# Patient Record
Sex: Male | Born: 1965 | Race: White | Hispanic: No | Marital: Single | Smoking: Former smoker
Health system: Southern US, Community
[De-identification: ages and names within clinical notes are randomized; demographics above are authoritative.]

## PROBLEM LIST (undated history)

## (undated) DIAGNOSIS — Z720 Tobacco use: Secondary | ICD-10-CM

## (undated) DIAGNOSIS — G5603 Carpal tunnel syndrome, bilateral upper limbs: Secondary | ICD-10-CM

## (undated) DIAGNOSIS — E66811 Obesity, class 1: Secondary | ICD-10-CM

## (undated) DIAGNOSIS — G44009 Cluster headache syndrome, unspecified, not intractable: Secondary | ICD-10-CM

## (undated) DIAGNOSIS — J449 Chronic obstructive pulmonary disease, unspecified: Secondary | ICD-10-CM

## (undated) DIAGNOSIS — S62339A Displaced fracture of neck of unspecified metacarpal bone, initial encounter for closed fracture: Secondary | ICD-10-CM

## (undated) DIAGNOSIS — E669 Obesity, unspecified: Secondary | ICD-10-CM

## (undated) HISTORY — DX: Obesity, unspecified: E66.9

## (undated) HISTORY — DX: Obesity, class 1: E66.811

## (undated) HISTORY — DX: Cluster headache syndrome, unspecified, not intractable: G44.009

## (undated) HISTORY — DX: Tobacco use: Z72.0

---
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
## 2010-08-29 ENCOUNTER — Ambulatory Visit (INDEPENDENT_AMBULATORY_CARE_PROVIDER_SITE_OTHER): Payer: Self-pay | Admitting: Family Medicine

## 2010-08-29 ENCOUNTER — Encounter: Payer: Self-pay | Admitting: Family Medicine

## 2010-08-29 DIAGNOSIS — R509 Fever, unspecified: Secondary | ICD-10-CM

## 2010-08-29 LAB — CONVERTED CEMR LAB
Inflenza A Ag: NEGATIVE
Influenza B Ag: NEGATIVE
Nitrite: NEGATIVE
Specific Gravity, Urine: 1.025
WBC Urine, dipstick: NEGATIVE

## 2010-08-31 ENCOUNTER — Encounter: Payer: Self-pay | Admitting: Family Medicine

## 2010-09-04 NOTE — Assessment & Plan Note (Signed)
Summary: CHILLS/HEADACHE rm 4   Vital Signs:  Patient Profile:   45 Years Old Male CC:      chills and HA Height:     73 inches Weight:      196.50 pounds O2 Sat:      98 % O2 treatment:    Room Air Temp:     99.9 degrees F oral Pulse rate:   77 / minute Resp:     18 per minute BP sitting:   135 / 74  (left arm) Cuff size:   large  Vitals Entered By: Clemens Catholic LPN (August 29, 2010 2:40 PM)                  Updated Prior Medication List: No Medications Current Allergies: No known allergies History of Present Illness Chief Complaint: chills and HA History of Present Illness:  Subjective: Patient complains of a three day history of chills, myalgias, and mild headache.  No other localizing symptoms.  No history of tick bites. No sore throat No cough No pleuritic pain No wheezing No nasal congestion No post-nasal drainage No sinus pain/pressure No itchy/red eyes No earache No hemoptysis No SOB No nausea No vomiting No abdominal pain No diarrhea No skin rashes + mild fatigue No urinary symptoms  No rash    REVIEW OF SYSTEMS Constitutional Symptoms       Complains of chills.     Denies fever, night sweats, weight loss, weight gain, and fatigue.  Eyes       Denies change in vision, eye pain, eye discharge, glasses, contact lenses, and eye surgery. Ear/Nose/Throat/Mouth       Denies hearing loss/aids, change in hearing, ear pain, ear discharge, dizziness, frequent runny nose, frequent nose bleeds, sinus problems, sore throat, hoarseness, and tooth pain or bleeding.  Respiratory       Denies dry cough, productive cough, wheezing, shortness of breath, asthma, bronchitis, and emphysema/COPD.  Cardiovascular       Denies murmurs, chest pain, and tires easily with exhertion.    Gastrointestinal       Denies stomach pain, nausea/vomiting, diarrhea, constipation, blood in bowel movements, and indigestion. Genitourniary       Denies painful urination, kidney  stones, and loss of urinary control. Neurological       Complains of headaches.      Denies paralysis, seizures, and fainting/blackouts. Musculoskeletal       Denies muscle pain, joint pain, joint stiffness, decreased range of motion, redness, swelling, muscle weakness, and gout.  Skin       Denies bruising, unusual mles/lumps or sores, and hair/skin or nail changes.  Psych       Denies mood changes, temper/anger issues, anxiety/stress, speech problems, depression, and sleep problems. Other Comments: pt c/o chills, HA and head pressure, fever and achy x 3days. he has not taken any OTC meds.   Past History:  Past Medical History: Unremarkable  Past Surgical History: Denies surgical history  Family History: Family History Diabetes 1st degree relative Family History Hypertension  Social History: Current Smoker- 1PPD Drug use-no Alcohol use-no Smoking Status:  current Drug Use:  no   Appearance:  Patient appears healthy, stated age, and in no acute distress  Eyes:  Pupils are equal, round, and reactive to light and accomdation.  Extraocular movement is intact.  Conjunctivae are not inflamed.  Ears:  Canals normal.  Tympanic membranes normal.   Nose:  Normal septum.  Normal turbinates, mildly congested.   No sinus  tenderness present.  Mouth:  No lesions Pharynx:  Normal  Neck:  Supple.  Posterior shotty nodes somewhat prominent but nontender Lungs:  Clear to auscultation.  Breath sounds are equal.  Heart:  Regular rate and rhythm without murmurs, rubs, or gallops.  Abdomen:  Nontender without masses or hepatosplenomegaly.  Bowel sounds are present.  No CVA or flank tenderness.  Extremities:  No edema.  Skin:  No rash Flu test:  Negative urinalysis (dipstick):  2+ blood CBC:  WBC 5.3 Assessment New Problems: FEVER UNSPECIFIED (ICD-780.60) FAMILY HISTORY DIABETES 1ST DEGREE RELATIVE (ICD-V18.0)  SUSPECT EARLY VIRAL SYNDROME  Plan New Orders: Influenza A/B,AG, EIA  [[id number]] Urinalysis [CPT-81003] CBC w/Diff [[id number]] New Patient Level III [29562] Planning Comments:   Treat symptomatically for now:  rest, increased fluids, Tylenol or Ibuprofen as needed, check temp daily. Follow-up with PCP if not improving 3 to 4 days.   The patient and/or caregiver has been counseled thoroughly with regard to medications prescribed including dosage, schedule, interactions, rationale for use, and possible side effects and they verbalize understanding.  Diagnoses and expected course of recovery discussed and will return if not improved as expected or if the condition worsens. Patient and/or caregiver verbalized understanding.   Patient Instructions: 1)  If cold-like symptoms develop, begin Mucinex D (guaifenesin with decongestant) twice daily for congestion. 2)  Increase fluid intake, rest. 3)  If cough develops at night, begin Delsym Cough Suppressant at bedtime. 4)  May use Afrin nasal spray (or generic oxymetazoline) twice daily for about 5 days.  Also recommend using saline nasal spray several times daily and/or saline nasal irrigation. 5)  Followup with family doctor if not improving 7 to 10 days.   Orders Added: 1)  Influenza A/B,AG, EIA [[id number]] 2)  Urinalysis [CPT-81003] 3)  CBC w/Diff [[id number]] 4)  New Patient Level III [99203]    Laboratory Results   Urine Tests  Date/Time Received: August 29, 2010 3:03 PM  Date/Time Reported: August 29, 2010 3:03 PM   Routine Urinalysis   Color: amber Appearance: Clear Glucose: negative   (Normal Range: Negative) Bilirubin: 1+   (Normal Range: Negative) Ketone: trace (5)   (Normal Range: Negative) Spec. Gravity: 1.025   (Normal Range: 1.003-1.035) Blood: 2+   (Normal Range: Negative) pH: 6.0   (Normal Range: 5.0-8.0) Protein: 2+   (Normal Range: Negative) Urobilinogen: 4.0   (Normal Range: 0-1) Nitrite: negative   (Normal Range: Negative) Leukocyte Esterace: negative   (Normal Range:  Negative)    Date/Time Received: August 29, 2010 2:52 PM  Date/Time Reported: August 29, 2010 2:52 PM   Other Tests  Influenza A: negative Influenza B: negative  Kit Test Internal QC: Negative   (Normal Range: Negative)

## 2012-06-17 DIAGNOSIS — J449 Chronic obstructive pulmonary disease, unspecified: Secondary | ICD-10-CM

## 2012-06-17 HISTORY — DX: Chronic obstructive pulmonary disease, unspecified: J44.9

## 2013-02-25 ENCOUNTER — Ambulatory Visit (INDEPENDENT_AMBULATORY_CARE_PROVIDER_SITE_OTHER): Payer: Self-pay | Admitting: Sports Medicine

## 2013-02-25 ENCOUNTER — Ambulatory Visit (INDEPENDENT_AMBULATORY_CARE_PROVIDER_SITE_OTHER): Payer: Self-pay

## 2013-02-25 ENCOUNTER — Encounter: Payer: Self-pay | Admitting: Sports Medicine

## 2013-02-25 VITALS — BP 125/77 | HR 54 | Wt 199.0 lb

## 2013-02-25 DIAGNOSIS — R079 Chest pain, unspecified: Secondary | ICD-10-CM

## 2013-02-25 DIAGNOSIS — R071 Chest pain on breathing: Secondary | ICD-10-CM

## 2013-02-25 DIAGNOSIS — R0989 Other specified symptoms and signs involving the circulatory and respiratory systems: Secondary | ICD-10-CM

## 2013-02-25 DIAGNOSIS — R0781 Pleurodynia: Secondary | ICD-10-CM

## 2013-02-25 DIAGNOSIS — J449 Chronic obstructive pulmonary disease, unspecified: Secondary | ICD-10-CM | POA: Insufficient documentation

## 2013-02-25 MED ORDER — AZITHROMYCIN 250 MG PO TABS: ORAL_TABLET | ORAL | Status: DC

## 2013-02-25 MED ORDER — PREDNISONE 50 MG PO TABS: 50.0000 mg | ORAL_TABLET | Freq: Every day | ORAL | Status: DC

## 2013-02-25 MED ORDER — NAPROXEN 500 MG PO TABS: 500.0000 mg | ORAL_TABLET | Freq: Two times a day (BID) | ORAL | Status: DC

## 2013-02-25 NOTE — Assessment & Plan Note (Signed)
He does have pleuritic pain, left worse than right. Chest x-ray, prednisone, azithromycin, naproxen. Also checking some blood work. Return to see me in 2 weeks to see how things are going.

## 2013-02-25 NOTE — Progress Notes (Signed)
  Subjective:    CC: Establish care, chest pain.   HPI:  This is a very pleasant 47 year old male who comes here to establish care. Unfortunately for the past week and a half he's had a left-sided chest pain it is worse with deep breaths. Pain is localized, doesn't radiate, moderate, persistent, he does have a mild cough is mildly productive. No constitutional symptoms other than last week he felt somewhat hot while fishing. No GI symptoms, no rashes. His girlfriend had similar symptoms. He is a smoker, has cut down to approximately one cigarette per day during this illness. He does have pain predominately when he lays flat but denies any lower extremity swelling, or any exertional chest pain. No night sweats.  Past medical history, Surgical history, Family history not pertinant except as noted below, Social history, Allergies, and medications have been entered into the medical record, reviewed, and no changes needed.   Review of Systems: No headache, visual changes, nausea, vomiting, diarrhea, constipation, dizziness, abdominal pain, skin rash, fevers, chills, night sweats, swollen lymph nodes, weight loss, chest pain, body aches, joint swelling, muscle aches, shortness of breath, mood changes, visual or auditory hallucinations.  Objective:    General: Well Developed, well nourished, and in no acute distress.  Neuro: Alert and oriented x3, extra-ocular muscles intact, sensation grossly intact.  HEENT: Normocephalic, atraumatic, pupils equal round reactive to light, neck supple, no masses, no lymphadenopathy, thyroid nonpalpable.  Skin: Warm and dry, no rashes noted.  Cardiac: Regular rate and rhythm, no murmurs rubs or gallops.  Respiratory: Coarse sounds in the right lower lobe. Not using accessory muscles, speaking in full sentences.  Abdominal: Soft, nontender, nondistended, positive bowel sounds, no masses, no organomegaly.  Musculoskeletal: Shoulder, elbow, wrist, hip, knee, ankle stable,  and with full range of motion.  Impression and Recommendations:    The patient was counselled, risk factors were discussed, anticipatory guidance given.

## 2013-02-26 LAB — CBC WITH DIFFERENTIAL/PLATELET
Basophils Absolute: 0 K/uL (ref 0.0–0.1)
Basophils Relative: 0 % (ref 0–1)
Eosinophils Absolute: 0.2 10*3/uL (ref 0.0–0.7)
Eosinophils Relative: 2 % (ref 0–5)
HCT: 45.9 % (ref 39.0–52.0)
Hemoglobin: 15.2 g/dL (ref 13.0–17.0)
Lymphocytes Relative: 32 % (ref 12–46)
Lymphs Abs: 2.5 10*3/uL (ref 0.7–4.0)
MCH: 27 pg (ref 26.0–34.0)
MCHC: 33.1 g/dL (ref 30.0–36.0)
MCV: 81.7 fL (ref 78.0–100.0)
Monocytes Absolute: 0.7 K/uL (ref 0.1–1.0)
Monocytes Relative: 9 % (ref 3–12)
Neutro Abs: 4.3 K/uL (ref 1.7–7.7)
Neutrophils Relative %: 57 % (ref 43–77)
Platelets: 201 K/uL (ref 150–400)
RBC: 5.62 MIL/uL (ref 4.22–5.81)
RDW: 13.6 % (ref 11.5–15.5)
WBC: 7.7 10*3/uL (ref 4.0–10.5)

## 2013-02-26 LAB — COMPREHENSIVE METABOLIC PANEL WITH GFR
ALT: 25 U/L (ref 0–53)
Albumin: 4.4 g/dL (ref 3.5–5.2)
CO2: 28 meq/L (ref 19–32)
Calcium: 9.4 mg/dL (ref 8.4–10.5)
Chloride: 104 meq/L (ref 96–112)
Potassium: 4.4 meq/L (ref 3.5–5.3)
Sodium: 137 meq/L (ref 135–145)
Total Bilirubin: 0.6 mg/dL (ref 0.3–1.2)
Total Protein: 7 g/dL (ref 6.0–8.3)

## 2013-02-26 LAB — COMPREHENSIVE METABOLIC PANEL
AST: 17 U/L (ref 0–37)
Alkaline Phosphatase: 82 U/L (ref 39–117)
BUN: 15 mg/dL (ref 6–23)
Creat: 1.04 mg/dL (ref 0.50–1.35)
Glucose, Bld: 78 mg/dL (ref 70–99)

## 2013-02-26 LAB — BRAIN NATRIURETIC PEPTIDE: Brain Natriuretic Peptide: 3.7 pg/mL (ref 0.0–100.0)

## 2013-03-11 ENCOUNTER — Ambulatory Visit (INDEPENDENT_AMBULATORY_CARE_PROVIDER_SITE_OTHER): Payer: Self-pay | Admitting: Sports Medicine

## 2013-03-11 ENCOUNTER — Encounter: Payer: Self-pay | Admitting: Sports Medicine

## 2013-03-11 VITALS — BP 116/69 | HR 53 | Wt 204.0 lb

## 2013-03-11 DIAGNOSIS — J449 Chronic obstructive pulmonary disease, unspecified: Secondary | ICD-10-CM

## 2013-03-11 DIAGNOSIS — J4489 Other specified chronic obstructive pulmonary disease: Secondary | ICD-10-CM

## 2013-03-11 MED ORDER — ALBUTEROL SULFATE HFA 108 (90 BASE) MCG/ACT IN AERS: 2.0000 | INHALATION_SPRAY | RESPIRATORY_TRACT | Status: DC | PRN

## 2013-03-11 MED ORDER — NICOTINE 7 MG/24HR TD PT24: 1.0000 | MEDICATED_PATCH | TRANSDERMAL | Status: DC

## 2013-03-11 NOTE — Assessment & Plan Note (Addendum)
This is early, he is now asymptomatic after the prior treatment. Albuterol. He will return for spirometry. We discussed the pathophysiology in detail. He is currently working very hard on smoking cessation. Like to see him back in approximately one month. Pneumovax, flu vaccine at the next visit for his PFTs.

## 2013-03-11 NOTE — Addendum Note (Signed)
Addended by: Monica Becton on: 03/11/2013 01:30 PM   Modules accepted: Level of Service

## 2013-03-11 NOTE — Patient Instructions (Addendum)

## 2013-03-11 NOTE — Progress Notes (Signed)
  Subjective:    CC: Followup  HPI: Scott Reid was having some shortness of breath, pleuritic chest pain, and a cough at the last visit, chest x-ray isn't showing hyperaeration suggestive of early obstructive lung disease, I placed him on steroids, NSAIDs, antibiotics, and symptoms have improved. He does smoke, but only goes about 2-3 cigarettes per day. This is a decrease from before, he is eager to quit smoking, and eager to improve his health. He is asymptomatic today.  Past medical history, Surgical history, Family history not pertinant except as noted below, Social history, Allergies, and medications have been entered into the medical record, reviewed, and no changes needed.   Review of Systems: No fevers, chills, night sweats, weight loss, chest pain, or shortness of breath.   Objective:    General: Well Developed, well nourished, and in no acute distress.  Neuro: Alert and oriented x3, extra-ocular muscles intact, sensation grossly intact.  HEENT: Normocephalic, atraumatic, pupils equal round reactive to light, neck supple, no masses, no lymphadenopathy, thyroid nonpalpable.  Skin: Warm and dry, no rashes. Cardiac: Regular rate and rhythm, no murmurs rubs or gallops, no lower extremity edema.  Respiratory: Clear to auscultation bilaterally with the exception of a mild right-sided expiratory wheeze that sounds. Not using accessory muscles, speaking in full sentences.  Impression and Recommendations:    I spent 40 minutes with this patient, greater than 50% was face-to-face time counseling regarding obstructive lung disease.

## 2013-04-08 ENCOUNTER — Ambulatory Visit: Payer: Self-pay | Admitting: Sports Medicine

## 2017-06-17 DIAGNOSIS — G5603 Carpal tunnel syndrome, bilateral upper limbs: Secondary | ICD-10-CM

## 2017-06-17 HISTORY — DX: Carpal tunnel syndrome, bilateral upper limbs: G56.03

## 2018-02-11 DIAGNOSIS — S62339A Displaced fracture of neck of unspecified metacarpal bone, initial encounter for closed fracture: Secondary | ICD-10-CM

## 2018-02-11 HISTORY — DX: Displaced fracture of neck of unspecified metacarpal bone, initial encounter for closed fracture: S62.339A

## 2018-02-17 ENCOUNTER — Ambulatory Visit (INDEPENDENT_AMBULATORY_CARE_PROVIDER_SITE_OTHER): Payer: Self-pay | Admitting: Orthopaedic Surgery

## 2018-02-17 VITALS — Ht 73.0 in | Wt 204.0 lb

## 2018-02-17 DIAGNOSIS — S62367A Nondisplaced fracture of neck of fifth metacarpal bone, left hand, initial encounter for closed fracture: Secondary | ICD-10-CM
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
## 2018-02-20 ENCOUNTER — Telehealth (INDEPENDENT_AMBULATORY_CARE_PROVIDER_SITE_OTHER): Payer: Self-pay | Admitting: Orthopaedic Surgery

## 2018-02-20 ENCOUNTER — Other Ambulatory Visit (INDEPENDENT_AMBULATORY_CARE_PROVIDER_SITE_OTHER): Payer: Self-pay

## 2018-02-20 MED ORDER — PREDNISONE 10 MG (21) PO TBPK: ORAL_TABLET | ORAL | 0 refills | Status: DC

## 2018-02-20 NOTE — Telephone Encounter (Signed)
Try prednisone pak.

## 2018-02-20 NOTE — Telephone Encounter (Signed)
Sent in to pharm.

## 2018-02-20 NOTE — Telephone Encounter (Signed)
Patient called advised he is still having pain and stiffness in his neck. Patient also said both hands are going numb. Patient asked if he need to schedule another appointment sooner than 03/10/18 to have his neck and hands checked. The number  to contact patient is [phone]

## 2018-02-20 NOTE — Telephone Encounter (Signed)
See message below °

## 2018-03-10 ENCOUNTER — Encounter (INDEPENDENT_AMBULATORY_CARE_PROVIDER_SITE_OTHER): Payer: Self-pay | Admitting: Orthopaedic Surgery

## 2018-03-10 ENCOUNTER — Ambulatory Visit (INDEPENDENT_AMBULATORY_CARE_PROVIDER_SITE_OTHER): Payer: Self-pay

## 2018-03-10 ENCOUNTER — Ambulatory Visit (INDEPENDENT_AMBULATORY_CARE_PROVIDER_SITE_OTHER): Payer: Self-pay | Admitting: Orthopaedic Surgery

## 2018-03-10 DIAGNOSIS — M79642 Pain in left hand: Secondary | ICD-10-CM

## 2018-03-10 DIAGNOSIS — S62367A Nondisplaced fracture of neck of fifth metacarpal bone, left hand, initial encounter for closed fracture: Secondary | ICD-10-CM
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
## 2018-03-10 NOTE — Addendum Note (Signed)
Addended by: Albertina ParrGARCIA, Jeffrey Graefe on: 03/10/2018 11:01 AM   Modules accepted: Orders

## 2018-04-01 ENCOUNTER — Ambulatory Visit (INDEPENDENT_AMBULATORY_CARE_PROVIDER_SITE_OTHER): Payer: Self-pay | Admitting: Physical Medicine and Rehabilitation

## 2018-04-01 ENCOUNTER — Encounter (INDEPENDENT_AMBULATORY_CARE_PROVIDER_SITE_OTHER): Payer: Self-pay | Admitting: Physical Medicine and Rehabilitation

## 2018-04-01 DIAGNOSIS — R202 Paresthesia of skin: Secondary | ICD-10-CM

## 2018-04-01 NOTE — Progress Notes (Signed)
 .  Numeric Pain Rating Scale and Functional Assessment Average Pain 7   In the last MONTH (on 0-10 scale) has pain interfered with the following?  1. General activity like being  able to carry out your everyday physical activities such as walking, climbing stairs, carrying groceries, or moving a chair?  Rating(5)   
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
## 2018-04-06 NOTE — Procedures (Signed)
EMG & NCV Findings: Evaluation of the left median motor and the right median motor nerves showed prolonged distal onset latency (L4.8, R6.6 ms) and decreased conduction velocity (Elbow-Wrist, L43, R40 m/s).  The right ulnar motor nerve showed decreased conduction velocity (B Elbow-Wrist, 52 m/s).  The left median (across palm) sensory nerve showed prolonged distal peak latency (Wrist, 5.2 ms) and prolonged distal peak latency (Palm, 2.2 ms).  The right median (across palm) sensory nerve showed no response (Palm), prolonged distal peak latency (6.7 ms), and reduced amplitude (3.6 V).  The right ulnar sensory nerve showed prolonged distal peak latency (4.1 ms), reduced amplitude (12.9 V), and decreased conduction velocity (Wrist-5th Digit, 34 m/s).  All remaining nerves (as indicated in the following tables) were within normal limits.  Left vs. Right side comparison data for the median motor nerve indicates abnormal L-R latency difference (1.8 ms).  The ulnar motor nerve indicates abnormal L-R amplitude difference (66.7 %).  The ulnar sensory nerve indicates abnormal L-R latency difference (0.8 ms).    All examined muscles (as indicated in the following table) showed no evidence of electrical instability.    Impression: The above electrodiagnostic study is ABNORMAL and reveals evidence of:  1.  A moderate BILATERAL median nerve entrapment at the wrist (carpal tunnel syndrome) affecting sensory and motor components.   2.  A mild right ulnar nerve entrapment at the elbow( cubital tunnel syndrome) affecting sensory components.   There is no significant electrodiagnostic evidence of any other focal nerve entrapment, brachial plexopathy or cervical radiculopathy.   Recommendations: 1.  Follow-up with referring physician. 2.  Continue current management of symptoms. 3.  Continue use of resting splint at night-time and as needed during the day. 4.  Suggest surgical  evaluation.  ___________________________ Scott Reid FAAPMR Board Certified, American Board of Physical Medicine and Rehabilitation    Nerve Conduction Studies Anti Sensory Summary Table   Stim Site NR Peak (ms) Norm Peak (ms) P-T Amp (V) Norm P-T Amp Site1 Site2 Delta-P (ms) Dist (cm) Vel (m/s) Norm Vel (m/s)  Left Median Acr Palm Anti Sensory (2nd Digit)  32.5C  Wrist    *5.2 <3.6 13.6 >10 Wrist Palm 3.0 0.0    Palm    *2.2 <2.0 14.3         Right Median Acr Palm Anti Sensory (2nd Digit)  30.6C  Wrist    *6.7 <3.6 *3.6 >10 Wrist Palm  0.0    Palm *NR  <2.0          Left Radial Anti Sensory (Base 1st Digit)  31.6C  Wrist    2.2 <3.1 28.5  Wrist Base 1st Digit 2.2 0.0    Right Radial Anti Sensory (Base 1st Digit)  31.5C  Wrist    2.3 <3.1 13.6  Wrist Base 1st Digit 2.3 0.0    Left Ulnar Anti Sensory (5th Digit)  32.3C  Wrist    3.3 <3.7 19.6 >15.0 Wrist 5th Digit 3.3 14.0 42 >38  Right Ulnar Anti Sensory (5th Digit)  31.5C  Wrist    *4.1 <3.7 *12.9 >15.0 Wrist 5th Digit 4.1 14.0 *34 >38   Motor Summary Table   Stim Site NR Onset (ms) Norm Onset (ms) O-P Amp (mV) Norm O-P Amp Site1 Site2 Delta-0 (ms) Dist (cm) Vel (m/s) Norm Vel (m/s)  Left Median Motor (Abd Poll Brev)  31.6C  Wrist    *4.8 <4.2 7.2 >5 Elbow Wrist 4.9 21.0 *43 >50  Elbow    9.7  6.9         Right Median Motor (Abd Poll Brev)  31.3C  Wrist    *6.6 <4.2 7.2 >5 Elbow Wrist 5.4 21.5 *40 >50  Elbow    12.0  7.0         Left Ulnar Motor (Abd Dig Min)  31.6C  Wrist    3.6 <4.2 10.2 >3 B Elbow Wrist 3.9 21.2 54 >53  B Elbow    7.5  9.4  A Elbow B Elbow 1.6 10.0 63 >53  A Elbow    9.1  9.2         Right Ulnar Motor (Abd Dig Min)  31.1C  Wrist    4.0 <4.2 3.4 >3 B Elbow Wrist 4.1 21.5 *52 >53  B Elbow    8.1  5.0  A Elbow B Elbow 1.9 10.0 53 >53  A Elbow    10.0  7.3          EMG   Side Muscle Nerve Root Ins Act Fibs Psw Amp Dur Poly Recrt Int Dennie Bible Comment  Right Abd Poll Brev Median C8-T1 Nml Nml Nml  Nml Nml 0 Nml Nml   Right 1stDorInt Ulnar C8-T1 Nml Nml Nml Nml Nml 0 Nml Nml   Right PronatorTeres Median C6-7 Nml Nml Nml Nml Nml 0 Nml Nml   Right Biceps Musculocut C5-6 Nml Nml Nml Nml Nml 0 Nml Nml   Right Deltoid Axillary C5-6 Nml Nml Nml Nml Nml 0 Nml Nml     Nerve Conduction Studies Anti Sensory Left/Right Comparison   Stim Site L Lat (ms) R Lat (ms) L-R Lat (ms) L Amp (V) R Amp (V) L-R Amp (%) Site1 Site2 L Vel (m/s) R Vel (m/s) L-R Vel (m/s)  Median Acr Palm Anti Sensory (2nd Digit)  32.5C  Wrist *5.2 *6.7 1.5 13.6 *3.6 73.5 Wrist Palm     Palm *2.2   14.3         Radial Anti Sensory (Base 1st Digit)  31.6C  Wrist 2.2 2.3 0.1 28.5 13.6 52.3 Wrist Base 1st Digit     Ulnar Anti Sensory (5th Digit)  32.3C  Wrist 3.3 *4.1 *0.8 19.6 *12.9 34.2 Wrist 5th Digit 42 *34 8   Motor Left/Right Comparison   Stim Site L Lat (ms) R Lat (ms) L-R Lat (ms) L Amp (mV) R Amp (mV) L-R Amp (%) Site1 Site2 L Vel (m/s) R Vel (m/s) L-R Vel (m/s)  Median Motor (Abd Poll Brev)  31.6C  Wrist *4.8 *6.6 *1.8 7.2 7.2 0.0 Elbow Wrist *43 *40 3  Elbow 9.7 12.0 2.3 6.9 7.0 1.4       Ulnar Motor (Abd Dig Min)  31.6C  Wrist 3.6 4.0 0.4 10.2 3.4 *66.7 B Elbow Wrist 54 *52 2  B Elbow 7.5 8.1 0.6 9.4 5.0 46.8 A Elbow B Elbow 63 53 10  A Elbow 9.1 10.0 0.9 9.2 7.3 20.7          Waveforms:

## 2018-04-07 ENCOUNTER — Ambulatory Visit (INDEPENDENT_AMBULATORY_CARE_PROVIDER_SITE_OTHER): Payer: Self-pay

## 2018-04-07 ENCOUNTER — Ambulatory Visit (INDEPENDENT_AMBULATORY_CARE_PROVIDER_SITE_OTHER): Payer: Self-pay | Admitting: Orthopaedic Surgery

## 2018-04-07 DIAGNOSIS — G5601 Carpal tunnel syndrome, right upper limb: Secondary | ICD-10-CM

## 2018-04-07 DIAGNOSIS — S62367A Nondisplaced fracture of neck of fifth metacarpal bone, left hand, initial encounter for closed fracture: Secondary | ICD-10-CM

## 2018-04-07 DIAGNOSIS — G5602 Carpal tunnel syndrome, left upper limb: Secondary | ICD-10-CM
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
## 2018-05-05 ENCOUNTER — Other Ambulatory Visit (INDEPENDENT_AMBULATORY_CARE_PROVIDER_SITE_OTHER): Payer: Self-pay | Admitting: Orthopaedic Surgery

## 2018-05-05 ENCOUNTER — Ambulatory Visit (INDEPENDENT_AMBULATORY_CARE_PROVIDER_SITE_OTHER): Payer: Self-pay | Admitting: Orthopaedic Surgery

## 2018-05-05 ENCOUNTER — Ambulatory Visit (INDEPENDENT_AMBULATORY_CARE_PROVIDER_SITE_OTHER): Payer: Self-pay

## 2018-05-05 DIAGNOSIS — S62367A Nondisplaced fracture of neck of fifth metacarpal bone, left hand, initial encounter for closed fracture: Secondary | ICD-10-CM
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
## 2018-06-02 ENCOUNTER — Encounter (INDEPENDENT_AMBULATORY_CARE_PROVIDER_SITE_OTHER): Payer: Self-pay | Admitting: Orthopaedic Surgery

## 2018-06-02 ENCOUNTER — Ambulatory Visit (INDEPENDENT_AMBULATORY_CARE_PROVIDER_SITE_OTHER): Payer: Self-pay | Admitting: Orthopaedic Surgery

## 2018-06-02 DIAGNOSIS — S62367A Nondisplaced fracture of neck of fifth metacarpal bone, left hand, initial encounter for closed fracture: Secondary | ICD-10-CM
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
## 2018-12-11 ENCOUNTER — Encounter (HOSPITAL_BASED_OUTPATIENT_CLINIC_OR_DEPARTMENT_OTHER): Payer: Self-pay | Admitting: *Deleted

## 2018-12-11 ENCOUNTER — Ambulatory Visit: Payer: Self-pay | Admitting: *Deleted

## 2018-12-11 ENCOUNTER — Emergency Department (HOSPITAL_BASED_OUTPATIENT_CLINIC_OR_DEPARTMENT_OTHER)
Admission: EM | Admit: 2018-12-11 | Discharge: 2018-12-11 | Disposition: A | Discharge status: Home / Self Care | Payer: Self-pay | Attending: Emergency Medicine | Admitting: Emergency Medicine

## 2018-12-11 ENCOUNTER — Emergency Department (HOSPITAL_BASED_OUTPATIENT_CLINIC_OR_DEPARTMENT_OTHER): Payer: Self-pay

## 2018-12-11 ENCOUNTER — Other Ambulatory Visit: Payer: Self-pay

## 2018-12-11 DIAGNOSIS — Z20828 Contact with and (suspected) exposure to other viral communicable diseases: Secondary | ICD-10-CM | POA: Insufficient documentation

## 2018-12-11 DIAGNOSIS — F17200 Nicotine dependence, unspecified, uncomplicated: Secondary | ICD-10-CM | POA: Insufficient documentation

## 2018-12-11 DIAGNOSIS — Z79899 Other long term (current) drug therapy: Secondary | ICD-10-CM | POA: Insufficient documentation

## 2018-12-11 DIAGNOSIS — J449 Chronic obstructive pulmonary disease, unspecified: Secondary | ICD-10-CM | POA: Insufficient documentation

## 2018-12-11 DIAGNOSIS — R0600 Dyspnea, unspecified: Secondary | ICD-10-CM | POA: Insufficient documentation

## 2018-12-11 MED ORDER — ALBUTEROL SULFATE HFA 108 (90 BASE) MCG/ACT IN AERS
1.0000 | INHALATION_SPRAY | Freq: Once | RESPIRATORY_TRACT | Status: AC
  Administered 2018-12-11: 1 via RESPIRATORY_TRACT
  Filled 2018-12-11: qty 6.7

## 2018-12-11 NOTE — ED Triage Notes (Signed)
States he feels like he is not getting a good breath without breathing hard. Worse at night when he lays down. He has been trying to quit smoking for the past 2 weeks.

## 2018-12-11 NOTE — ED Provider Notes (Signed)
[facility] EMERGENCY DEPARTMENT Provider Note   CSN: [id number] Arrival date & time: 12/11/18  1309     History   Chief Complaint No chief complaint on file.   HPI Scott Reid is a 53 y.o. male.     HPI   52yM with dyspnea. Vague sensation that he isn't getting enough air. Has happened a couple times per night for the last several nights. Feels good during the day/while at work. No cough. No fever or chills. No unusual leg pain or swelling. Can lay down on his back such as on the couch and breathing feels fine. Smoker but says just stopped a couple weeks ago.   History reviewed. No pertinent past medical history.  Patient Active Problem List   Diagnosis Date Noted  . COPD (chronic obstructive pulmonary disease) ([place]) 02/25/2013    History reviewed. No pertinent surgical history.      Home Medications    Prior to Admission medications   Medication Sig Start Date End Date Taking? Authorizing Provider  albuterol (PROVENTIL HFA;VENTOLIN HFA) 108 (90 BASE) MCG/ACT inhaler Inhale 2 puffs into the lungs every 4 (four) hours as needed for wheezing or shortness of breath. 03/11/13   Silverio Decamp, MD  naproxen (NAPROSYN) 500 MG tablet Take 1 tablet (500 mg total) by mouth 2 (two) times daily with a meal. 02/25/13   Silverio Decamp, MD  nicotine (NICODERM CQ - DOSED IN MG/24 HR) 7 mg/24hr patch Place 1 patch onto the skin daily. 03/11/13   Silverio Decamp, MD  predniSONE (STERAPRED UNI-PAK 21 TAB) 10 MG (21) TBPK tablet TAKE AS DIRECTED 02/20/18   Leandrew Koyanagi, MD    Family History Family History  Problem Relation Age of Onset  . Diabetes Mother   . Hypertension Mother   . Diabetes Father   . Hypertension Father     Social History Social History   Tobacco Use  . Smoking status: Current Every Day Smoker  . Smokeless tobacco: Never Used  Substance Use Topics  . Alcohol use: No  . Drug use: Not on file     Allergies   Patient has  no known allergies.   Review of Systems Review of Systems  All systems reviewed and negative, other than as noted in HPI.  Physical Exam Updated Vital Signs BP 132/82   Pulse 64   Temp 97.9 F (36.6 C) (Oral)   Resp 16   Ht 6\' 1"  (1.854 m)   Wt 95.3 kg   SpO2 99%   BMI 27.71 kg/m   Physical Exam Vitals signs and nursing note reviewed.  Constitutional:      General: He is not in acute distress.    Appearance: He is well-developed.  HENT:     Head: Normocephalic and atraumatic.  Eyes:     General:        Right eye: No discharge.        Left eye: No discharge.     Conjunctiva/sclera: Conjunctivae normal.  Neck:     Musculoskeletal: Neck supple.  Cardiovascular:     Rate and Rhythm: Normal rate and regular rhythm.     Heart sounds: Normal heart sounds. No murmur. No friction rub. No gallop.   Pulmonary:     Effort: Pulmonary effort is normal. No respiratory distress.     Breath sounds: Normal breath sounds.  Abdominal:     General: There is no distension.     Palpations: Abdomen is soft.  Tenderness: There is no abdominal tenderness.  Musculoskeletal:        General: No tenderness.     Comments: Lower extremities symmetric as compared to each other. No calf tenderness. Negative Homan's. No palpable cords.   Skin:    General: Skin is warm and dry.  Neurological:     Mental Status: He is alert.  Psychiatric:        Behavior: Behavior normal.        Thought Content: Thought content normal.      ED Treatments / Results  Labs (all labs ordered are listed, but only abnormal results are displayed) Labs Reviewed  NOVEL CORONAVIRUS, NAA (HOSPITAL ORDER, SEND-OUT TO REF LAB)    EKG    Radiology Dg Chest 2 View  Result Date: 12/11/2018 CLINICAL DATA:  Shortness of breath EXAM: CHEST - 2 VIEW COMPARISON:  February 25, 2013 FINDINGS: Lungs are clear. Heart size and pulmonary vascularity are normal. No adenopathy. No bone lesions. IMPRESSION: No edema or  consolidation. Electronically Signed   By: Bretta BangWilliam  Woodruff III M.D.   On: 12/11/2018 13:58    Procedures Procedures (including critical care time)  Medications Ordered in ED Medications  albuterol (VENTOLIN HFA) 108 (90 Base) MCG/ACT inhaler 1 puff (has no administration in time range)     Initial Impression / Assessment and Plan / ED Course  I have reviewed the triage vital signs and the nursing notes.  Pertinent labs & imaging results that were available during my care of the patient were reviewed by me and considered in my medical decision making (see chart for details).    52yM with dyspnea. I suspect there may be component of anxiety about possible COVID. Looks well. Faint wheezing but overall moving good air. CXR clear. Symptoms at night but doesn't seem volume overloaded. I doubt PE. Plan symptomatic tx. Will test for COVID. Quarantine otherwise.    Scott Reid was evaluated in Emergency Department on 12/11/2018 for the symptoms described in the history of present illness. He was evaluated in the context of the global COVID-19 pandemic, which necessitated consideration that the patient might be at risk for infection with the SARS-CoV-2 virus that causes COVID-19. Institutional protocols and algorithms that pertain to the evaluation of patients at risk for COVID-19 are in a state of rapid change based on information released by regulatory bodies including the CDC and federal and state organizations. These policies and algorithms were followed during the patient's care in the ED.   Final Clinical Impressions(s) / ED Diagnoses   Final diagnoses:  Dyspnea, unspecified type    ED Discharge Orders    None       Raeford RazorKohut, Tedi Hughson, MD 12/13/18 [phone]

## 2018-12-11 NOTE — ED Notes (Signed)
Pt. Reports he recently quit smoking and is now having some anxiety with shortness of breath when he tries to lay down and sleep at night.  Pt. Reports he feels like he breaths fine during the day most of the time but at night he has trouble.  Pt. Recently was given an inhaler but while moving misplaced it.

## 2018-12-11 NOTE — Telephone Encounter (Signed)
Did not triage this pt at this time.

## 2018-12-11 NOTE — Telephone Encounter (Signed)
Pt called in c/o not being able to take a deep breath.  No issues with shortness of breath.  He does have COPD and smokes.  He does not have a PCP so I referred him to an urgent care.   He is going to the one near him in [place].    Reason for Disposition . [1] Longstanding difficulty breathing (e.g., CHF, COPD, emphysema) AND [2] WORSE than normal  Answer Assessment - Initial Assessment Questions 1. RESPIRATORY STATUS: "Describe your breathing?" (e.g., wheezing, shortness of breath, unable to speak, severe coughing)      It's hard for me to take a deep breath.   I'm breathing fine.   I'm feeling anxious. 2 2. ONSET: "When did this breathing problem begin?"      Two nights ago I have not been able to sleep laying down.   I have COPD and I smoke.    Not coughing but if I do nothing comes up. 3. PATTERN "Does the difficult breathing come and go, or has it been constant since it started?"      No pain just can't take a deep breath. I bought a Primitine misk inhaler OTC but it did not help. 4. SEVERITY: "How bad is your breathing?" (e.g., mild, moderate, severe)    - MILD: No SOB at rest, mild SOB with walking, speaks normally in sentences, can lay down, no retractions, pulse < 100.    - MODERATE: SOB at rest, SOB with minimal exertion and prefers to sit, cannot lie down flat, speaks in phrases, mild retractions, audible wheezing, pulse 100-120.    - SEVERE: Very SOB at rest, speaks in single words, struggling to breathe, sitting hunched forward, retractions, pulse > 120      I'm breathing normal with activity. 5. RECURRENT SYMPTOM: "Have you had difficulty breathing before?" If so, ask: "When was the last time?" and "What happened that time?"      YesI went to dr 2 yrs ago and was told I have COPD.   I'm trying to quit smoking. 6. CARDIAC HISTORY: "Do you have any history of heart disease?" (e.g., heart attack, angina, bypass surgery, angioplasty)      No 7. LUNG HISTORY: "Do you have any  history of lung disease?"  (e.g., pulmonary embolus, asthma, emphysema)     COPD  No blood clots in legs or lungs. 8. CAUSE: "What do you think is causing the breathing problem?"      Maybe the COPD.   Smoking too  And anxiety  9. OTHER SYMPTOMS: "Do you have any other symptoms? (e.g., dizziness, runny nose, cough, chest pain, fever)     My nose stays stopped up all the time. 10. PREGNANCY: "Is there any chance you are pregnant?" "When was your last menstrual period?"       N/A 11. TRAVEL: "Have you traveled out of the country in the last month?" (e.g., travel history, exposure)       No travels.    No exposures to COVID-19.  Protocols used: BREATHING DIFFICULTY-A-AH

## 2018-12-12 LAB — NOVEL CORONAVIRUS, NAA (HOSP ORDER, SEND-OUT TO REF LAB; TAT 18-24 HRS): SARS-CoV-2, NAA: NOT DETECTED

## 2019-01-29 ENCOUNTER — Ambulatory Visit: Payer: Self-pay | Admitting: Family Medicine

## 2019-02-19 ENCOUNTER — Encounter: Payer: Self-pay | Admitting: Family Medicine

## 2019-02-19 ENCOUNTER — Other Ambulatory Visit: Payer: Self-pay

## 2019-02-19 ENCOUNTER — Ambulatory Visit (INDEPENDENT_AMBULATORY_CARE_PROVIDER_SITE_OTHER): Payer: Self-pay | Admitting: Family Medicine

## 2019-02-19 VITALS — BP 126/78 | HR 68 | Temp 97.9°F | Resp 16 | Ht 73.0 in | Wt 214.0 lb

## 2019-02-19 DIAGNOSIS — F5105 Insomnia due to other mental disorder: Secondary | ICD-10-CM

## 2019-02-19 DIAGNOSIS — F99 Mental disorder, not otherwise specified: Secondary | ICD-10-CM

## 2019-02-19 DIAGNOSIS — F17200 Nicotine dependence, unspecified, uncomplicated: Secondary | ICD-10-CM

## 2019-02-19 DIAGNOSIS — J449 Chronic obstructive pulmonary disease, unspecified: Secondary | ICD-10-CM

## 2019-02-19 DIAGNOSIS — F419 Anxiety disorder, unspecified: Secondary | ICD-10-CM

## 2019-02-19 MED ORDER — TRAZODONE HCL 50 MG PO TABS: ORAL_TABLET | ORAL | 5 refills | Status: DC

## 2019-02-19 MED ORDER — ALBUTEROL SULFATE HFA 108 (90 BASE) MCG/ACT IN AERS: 1.0000 | INHALATION_SPRAY | RESPIRATORY_TRACT | 2 refills | Status: DC | PRN
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
## 2019-03-20 ENCOUNTER — Emergency Department (HOSPITAL_BASED_OUTPATIENT_CLINIC_OR_DEPARTMENT_OTHER)
Admission: EM | Admit: 2019-03-20 | Discharge: 2019-03-20 | Disposition: A | Discharge status: Home / Self Care | Payer: Self-pay | Attending: Emergency Medicine | Admitting: Emergency Medicine

## 2019-03-20 ENCOUNTER — Encounter (HOSPITAL_BASED_OUTPATIENT_CLINIC_OR_DEPARTMENT_OTHER): Payer: Self-pay | Admitting: Emergency Medicine

## 2019-03-20 ENCOUNTER — Other Ambulatory Visit: Payer: Self-pay

## 2019-03-20 ENCOUNTER — Emergency Department (HOSPITAL_BASED_OUTPATIENT_CLINIC_OR_DEPARTMENT_OTHER): Payer: Self-pay

## 2019-03-20 DIAGNOSIS — J449 Chronic obstructive pulmonary disease, unspecified: Secondary | ICD-10-CM | POA: Insufficient documentation

## 2019-03-20 DIAGNOSIS — F5105 Insomnia due to other mental disorder: Secondary | ICD-10-CM | POA: Insufficient documentation

## 2019-03-20 DIAGNOSIS — G44019 Episodic cluster headache, not intractable: Secondary | ICD-10-CM | POA: Insufficient documentation

## 2019-03-20 DIAGNOSIS — Z87891 Personal history of nicotine dependence: Secondary | ICD-10-CM | POA: Insufficient documentation

## 2019-03-20 DIAGNOSIS — F419 Anxiety disorder, unspecified: Secondary | ICD-10-CM | POA: Insufficient documentation

## 2019-03-20 MED ORDER — LORAZEPAM 1 MG PO TABS: 1.0000 mg | ORAL_TABLET | Freq: Every evening | ORAL | 0 refills | Status: DC | PRN

## 2019-03-20 MED ORDER — SUMATRIPTAN SUCCINATE 50 MG PO TABS: ORAL_TABLET | ORAL | 0 refills | Status: DC
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
## 2019-03-20 NOTE — ED Triage Notes (Addendum)
Pt reports intermittent headaches behind L eye for the last couple of weeks. States his eye gets red "bloodshot." Repots increase in anxiety. States no nausea or dizziness. States no sensitivity to light or sound. Advil, tylenol, aleve and excedrin not effective for pain.  States he gets headaches at the same time everyday, rates his pain a 2/10 now but states it was worse earlier.

## 2019-03-21 ENCOUNTER — Other Ambulatory Visit: Payer: Self-pay | Admitting: Family Medicine

## 2019-04-01 ENCOUNTER — Ambulatory Visit (INDEPENDENT_AMBULATORY_CARE_PROVIDER_SITE_OTHER): Payer: Self-pay | Admitting: Family Medicine

## 2019-04-01 ENCOUNTER — Encounter: Payer: Self-pay | Admitting: Family Medicine

## 2019-04-01 ENCOUNTER — Other Ambulatory Visit: Payer: Self-pay

## 2019-04-01 VITALS — BP 120/82 | HR 81 | Temp 98.1°F | Resp 16 | Ht 73.0 in | Wt 218.6 lb

## 2019-04-01 DIAGNOSIS — F5105 Insomnia due to other mental disorder: Secondary | ICD-10-CM

## 2019-04-01 DIAGNOSIS — G44019 Episodic cluster headache, not intractable: Secondary | ICD-10-CM

## 2019-04-01 DIAGNOSIS — F419 Anxiety disorder, unspecified: Secondary | ICD-10-CM

## 2019-04-01 DIAGNOSIS — F99 Mental disorder, not otherwise specified: Secondary | ICD-10-CM

## 2019-04-01 MED ORDER — SUMATRIPTAN SUCCINATE 50 MG PO TABS: ORAL_TABLET | ORAL | 1 refills | Status: DC

## 2019-04-01 MED ORDER — PROPRANOLOL HCL ER 80 MG PO CP24: 80.0000 mg | ORAL_CAPSULE | Freq: Every day | ORAL | 1 refills | Status: DC

## 2019-04-01 MED ORDER — LORAZEPAM 1 MG PO TABS: 1.0000 mg | ORAL_TABLET | Freq: Every evening | ORAL | 0 refills | Status: DC | PRN
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
## 2019-06-28 ENCOUNTER — Other Ambulatory Visit: Payer: Self-pay

## 2019-06-28 ENCOUNTER — Telehealth: Payer: Self-pay

## 2019-06-28 MED ORDER — SUMATRIPTAN SUCCINATE 50 MG PO TABS: ORAL_TABLET | ORAL | 1 refills | Status: DC

## 2019-06-28 NOTE — Telephone Encounter (Signed)
Patient is out SUMAtriptan (IMITREX) 50 MG tablet. Please send Rx to [facility] at [facility].

## 2019-06-28 NOTE — Telephone Encounter (Signed)
RX sent to [facility], pt advised.

## 2019-07-02 ENCOUNTER — Encounter: Payer: Self-pay | Admitting: Family Medicine

## 2019-07-02 ENCOUNTER — Ambulatory Visit (INDEPENDENT_AMBULATORY_CARE_PROVIDER_SITE_OTHER): Payer: Self-pay | Admitting: Family Medicine

## 2019-07-02 ENCOUNTER — Other Ambulatory Visit: Payer: Self-pay

## 2019-07-02 VITALS — BP 127/85 | HR 68 | Temp 98.0°F | Resp 16 | Ht 73.0 in | Wt 218.6 lb

## 2019-07-02 DIAGNOSIS — G4700 Insomnia, unspecified: Secondary | ICD-10-CM

## 2019-07-02 DIAGNOSIS — G44019 Episodic cluster headache, not intractable: Secondary | ICD-10-CM

## 2019-07-02 DIAGNOSIS — J4520 Mild intermittent asthma, uncomplicated: Secondary | ICD-10-CM

## 2019-07-02 MED ORDER — LORAZEPAM 1 MG PO TABS: 1.0000 mg | ORAL_TABLET | Freq: Every evening | ORAL | 1 refills | Status: DC | PRN
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
## 2019-07-27 ENCOUNTER — Other Ambulatory Visit: Payer: Self-pay | Admitting: Family Medicine

## 2019-09-30 ENCOUNTER — Other Ambulatory Visit: Payer: Self-pay

## 2019-09-30 ENCOUNTER — Telehealth: Payer: Self-pay

## 2019-09-30 DIAGNOSIS — J449 Chronic obstructive pulmonary disease, unspecified: Secondary | ICD-10-CM

## 2019-09-30 MED ORDER — SUMATRIPTAN SUCCINATE 50 MG PO TABS: ORAL_TABLET | ORAL | 1 refills | Status: DC

## 2019-09-30 MED ORDER — ALBUTEROL SULFATE HFA 108 (90 BASE) MCG/ACT IN AERS: 1.0000 | INHALATION_SPRAY | RESPIRATORY_TRACT | 2 refills | Status: DC | PRN

## 2019-09-30 NOTE — Telephone Encounter (Signed)
Refills sent in. Tried contacting patient to notify but unable to leave message. If pt calls back, please let him know.

## 2019-09-30 NOTE — Telephone Encounter (Signed)
Pt.notified

## 2019-09-30 NOTE — Telephone Encounter (Signed)
Patient called in requesting refills on medication   SUMAtriptan (IMITREX) 50 MG tablet   albuterol (VENTOLIN HFA) 108 (90 Base) MCG/ACT inhaler   [facility] [address], [place] - [address]

## 2019-11-08 ENCOUNTER — Telehealth: Payer: Self-pay

## 2019-11-08 NOTE — Telephone Encounter (Signed)
Both medications had 1-2 refills when sent last month on 4/15. Patient notified to contact pharmacy

## 2019-11-08 NOTE — Telephone Encounter (Signed)
Patient refill request  albuterol (VENTOLIN HFA) 108 (90 Base) MCG/ACT inhaler [[id number]]   SUMAtriptan (IMITREX) 50 MG tablet [[id number]]    [facility] [address], [place] [address]  [address] [place], [place] [address]  Phone:  [phone] Fax:  [phone]  DEA #:  --

## 2019-12-16 DIAGNOSIS — E7800 Pure hypercholesterolemia, unspecified: Secondary | ICD-10-CM

## 2019-12-16 HISTORY — DX: Pure hypercholesterolemia, unspecified: E78.00

## 2019-12-31 ENCOUNTER — Encounter: Payer: Self-pay | Admitting: Family Medicine

## 2019-12-31 ENCOUNTER — Ambulatory Visit (INDEPENDENT_AMBULATORY_CARE_PROVIDER_SITE_OTHER): Payer: Self-pay | Admitting: Family Medicine

## 2019-12-31 ENCOUNTER — Other Ambulatory Visit: Payer: Self-pay

## 2019-12-31 VITALS — BP 112/72 | HR 60 | Temp 98.3°F | Resp 16 | Ht 71.5 in | Wt 220.0 lb

## 2019-12-31 DIAGNOSIS — Z1211 Encounter for screening for malignant neoplasm of colon: Secondary | ICD-10-CM

## 2019-12-31 DIAGNOSIS — Z125 Encounter for screening for malignant neoplasm of prostate: Secondary | ICD-10-CM

## 2019-12-31 DIAGNOSIS — F419 Anxiety disorder, unspecified: Secondary | ICD-10-CM

## 2019-12-31 DIAGNOSIS — G44019 Episodic cluster headache, not intractable: Secondary | ICD-10-CM

## 2019-12-31 DIAGNOSIS — Z0000 Encounter for general adult medical examination without abnormal findings: Secondary | ICD-10-CM

## 2019-12-31 LAB — CBC WITH DIFFERENTIAL/PLATELET
Basophils Absolute: 0 10*3/uL (ref 0.0–0.1)
Basophils Relative: 0.5 % (ref 0.0–3.0)
Eosinophils Absolute: 0.2 10*3/uL (ref 0.0–0.7)
Eosinophils Relative: 2.4 % (ref 0.0–5.0)
HCT: 44.9 % (ref 39.0–52.0)
Hemoglobin: 14.9 g/dL (ref 13.0–17.0)
Lymphocytes Relative: 28.5 % (ref 12.0–46.0)
Lymphs Abs: 2.4 10*3/uL (ref 0.7–4.0)
MCHC: 33.2 g/dL (ref 30.0–36.0)
MCV: 84 fl (ref 78.0–100.0)
Monocytes Absolute: 0.6 10*3/uL (ref 0.1–1.0)
Monocytes Relative: 7.1 % (ref 3.0–12.0)
Neutro Abs: 5.1 10*3/uL (ref 1.4–7.7)
Neutrophils Relative %: 61.5 % (ref 43.0–77.0)
Platelets: 180 10*3/uL (ref 150.0–400.0)
RBC: 5.34 Mil/uL (ref 4.22–5.81)
RDW: 13.6 % (ref 11.5–15.5)
WBC: 8.3 10*3/uL (ref 4.0–10.5)

## 2019-12-31 LAB — COMPREHENSIVE METABOLIC PANEL
ALT: 20 U/L (ref 0–53)
AST: 15 U/L (ref 0–37)
Albumin: 4.5 g/dL (ref 3.5–5.2)
Alkaline Phosphatase: 98 U/L (ref 39–117)
BUN: 14 mg/dL (ref 6–23)
CO2: 26 mEq/L (ref 19–32)
Calcium: 9.2 mg/dL (ref 8.4–10.5)
Chloride: 105 mEq/L (ref 96–112)
Creatinine, Ser: 1.19 mg/dL (ref 0.40–1.50)
GFR: 63.48 mL/min (ref >60.00)
Glucose, Bld: 89 mg/dL (ref 70–99)
Potassium: 4.2 mEq/L (ref 3.5–5.1)
Sodium: 138 mEq/L (ref 135–145)
Total Bilirubin: 0.4 mg/dL (ref 0.2–1.2)
Total Protein: 6.8 g/dL (ref 6.0–8.3)

## 2019-12-31 LAB — LIPID PANEL
Cholesterol: 177 mg/dL (ref 0–200)
HDL: 36.4 mg/dL — ABNORMAL LOW (ref >39.00)
LDL Cholesterol: 123 mg/dL — ABNORMAL HIGH (ref 0–99)
NonHDL: 140.34
Total CHOL/HDL Ratio: 5
Triglycerides: 85 mg/dL (ref 0.0–149.0)
VLDL: 17 mg/dL (ref 0.0–40.0)

## 2019-12-31 LAB — PSA: PSA: 1.3 ng/mL (ref 0.10–4.00)

## 2019-12-31 LAB — TSH: TSH: 1.63 u[IU]/mL (ref 0.35–4.50)

## 2019-12-31 MED ORDER — VERAPAMIL HCL ER 120 MG PO TBCR: 120.0000 mg | EXTENDED_RELEASE_TABLET | Freq: Every day | ORAL | 2 refills | Status: DC

## 2019-12-31 MED ORDER — SUMATRIPTAN SUCCINATE 50 MG PO TABS: ORAL_TABLET | ORAL | 1 refills | Status: DC

## 2019-12-31 NOTE — Patient Instructions (Signed)
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
## 2020-01-02 ENCOUNTER — Encounter: Payer: Self-pay | Admitting: Family Medicine

## 2020-01-03 ENCOUNTER — Other Ambulatory Visit: Payer: Self-pay

## 2020-01-03 DIAGNOSIS — E7800 Pure hypercholesterolemia, unspecified: Secondary | ICD-10-CM

## 2020-01-03 MED ORDER — ATORVASTATIN CALCIUM 20 MG PO TABS: 20.0000 mg | ORAL_TABLET | Freq: Every day | ORAL | 2 refills | Status: DC

## 2020-02-25 ENCOUNTER — Ambulatory Visit (INDEPENDENT_AMBULATORY_CARE_PROVIDER_SITE_OTHER): Payer: Self-pay | Admitting: Family Medicine

## 2020-02-25 ENCOUNTER — Encounter: Payer: Self-pay | Admitting: Family Medicine

## 2020-02-25 ENCOUNTER — Other Ambulatory Visit: Payer: Self-pay

## 2020-02-25 VITALS — BP 115/73 | HR 64 | Temp 97.8°F | Resp 16 | Ht 71.5 in | Wt 216.8 lb

## 2020-02-25 DIAGNOSIS — G44019 Episodic cluster headache, not intractable: Secondary | ICD-10-CM

## 2020-02-25 DIAGNOSIS — E7800 Pure hypercholesterolemia, unspecified: Secondary | ICD-10-CM
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
## 2020-02-29 ENCOUNTER — Other Ambulatory Visit: Payer: Self-pay

## 2020-02-29 ENCOUNTER — Ambulatory Visit (INDEPENDENT_AMBULATORY_CARE_PROVIDER_SITE_OTHER): Payer: Self-pay | Admitting: Family Medicine

## 2020-02-29 DIAGNOSIS — E7800 Pure hypercholesterolemia, unspecified: Secondary | ICD-10-CM

## 2020-02-29 LAB — LIPID PANEL
Cholesterol: 118 mg/dL (ref 0–200)
HDL: 37.7 mg/dL — ABNORMAL LOW (ref >39.00)
LDL Cholesterol: 68 mg/dL (ref 0–99)
NonHDL: 79.96
Total CHOL/HDL Ratio: 3
Triglycerides: 61 mg/dL (ref 0.0–149.0)
VLDL: 12.2 mg/dL (ref 0.0–40.0)

## 2020-02-29 LAB — AST: AST: 18 U/L (ref 0–37)

## 2020-02-29 LAB — ALT: ALT: 30 U/L (ref 0–53)

## 2020-03-17 DIAGNOSIS — N201 Calculus of ureter: Secondary | ICD-10-CM

## 2020-03-17 HISTORY — DX: Calculus of ureter: N20.1

## 2020-03-28 ENCOUNTER — Emergency Department (HOSPITAL_BASED_OUTPATIENT_CLINIC_OR_DEPARTMENT_OTHER): Payer: Self-pay

## 2020-03-28 ENCOUNTER — Encounter (HOSPITAL_BASED_OUTPATIENT_CLINIC_OR_DEPARTMENT_OTHER): Payer: Self-pay | Admitting: *Deleted

## 2020-03-28 ENCOUNTER — Other Ambulatory Visit: Payer: Self-pay

## 2020-03-28 ENCOUNTER — Emergency Department (HOSPITAL_BASED_OUTPATIENT_CLINIC_OR_DEPARTMENT_OTHER)
Admission: EM | Admit: 2020-03-28 | Discharge: 2020-03-28 | Disposition: A | Discharge status: Home / Self Care | Payer: Self-pay | Attending: Emergency Medicine | Admitting: Emergency Medicine

## 2020-03-28 DIAGNOSIS — J449 Chronic obstructive pulmonary disease, unspecified: Secondary | ICD-10-CM | POA: Insufficient documentation

## 2020-03-28 DIAGNOSIS — N200 Calculus of kidney: Secondary | ICD-10-CM | POA: Insufficient documentation

## 2020-03-28 DIAGNOSIS — Z87891 Personal history of nicotine dependence: Secondary | ICD-10-CM | POA: Insufficient documentation

## 2020-03-28 LAB — URINALYSIS, MICROSCOPIC (REFLEX)

## 2020-03-28 LAB — URINALYSIS, ROUTINE W REFLEX MICROSCOPIC
Bilirubin Urine: NEGATIVE
Glucose, UA: NEGATIVE mg/dL
Ketones, ur: 15 mg/dL — AB
Leukocytes,Ua: NEGATIVE
Nitrite: NEGATIVE
Protein, ur: NEGATIVE mg/dL
Specific Gravity, Urine: 1.025 (ref 1.005–1.030)
pH: 6.5 (ref 5.0–8.0)

## 2020-03-28 LAB — BASIC METABOLIC PANEL
Anion gap: 12 (ref 5–15)
BUN: 29 mg/dL — ABNORMAL HIGH (ref 6–20)
CO2: 20 mmol/L — ABNORMAL LOW (ref 22–32)
Calcium: 9.5 mg/dL (ref 8.9–10.3)
Chloride: 107 mmol/L (ref 98–111)
Creatinine, Ser: 1.4 mg/dL — ABNORMAL HIGH (ref 0.61–1.24)
GFR, Estimated: 56 mL/min — ABNORMAL LOW (ref >60)
Glucose, Bld: 111 mg/dL — ABNORMAL HIGH (ref 70–99)
Potassium: 4.5 mmol/L (ref 3.5–5.1)
Sodium: 139 mmol/L (ref 135–145)

## 2020-03-28 MED ORDER — SODIUM CHLORIDE 0.9 % IV BOLUS
500.0000 mL | Freq: Once | INTRAVENOUS | Status: AC
  Administered 2020-03-28: 500 mL via INTRAVENOUS

## 2020-03-28 MED ORDER — OXYCODONE-ACETAMINOPHEN 5-325 MG PO TABS
1.0000 | ORAL_TABLET | Freq: Once | ORAL | Status: AC
  Administered 2020-03-28: 1 via ORAL
  Filled 2020-03-28: qty 1

## 2020-03-28 MED ORDER — SODIUM CHLORIDE 0.9 % IV BOLUS
1000.0000 mL | Freq: Once | INTRAVENOUS | Status: AC
  Administered 2020-03-28: 1000 mL via INTRAVENOUS

## 2020-03-28 MED ORDER — TAMSULOSIN HCL 0.4 MG PO CAPS: 0.4000 mg | ORAL_CAPSULE | Freq: Every day | ORAL | 0 refills | Status: DC

## 2020-03-28 MED ORDER — ONDANSETRON HCL 4 MG/2ML IJ SOLN
4.0000 mg | Freq: Once | INTRAMUSCULAR | Status: AC
  Administered 2020-03-28: 4 mg via INTRAVENOUS
  Filled 2020-03-28: qty 2

## 2020-03-28 MED ORDER — OXYCODONE-ACETAMINOPHEN 5-325 MG PO TABS: 2.0000 | ORAL_TABLET | ORAL | 0 refills | Status: DC | PRN

## 2020-03-28 MED ORDER — KETOROLAC TROMETHAMINE 15 MG/ML IJ SOLN
15.0000 mg | Freq: Once | INTRAMUSCULAR | Status: AC
  Administered 2020-03-28: 15 mg via INTRAVENOUS
  Filled 2020-03-28: qty 1
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
## 2020-03-28 NOTE — ED Triage Notes (Addendum)
C/o sudden onset  right flank pain x 1 week " on and off" also c/o hematuria

## 2020-03-28 NOTE — Discharge Instructions (Addendum)
You have been diagnosed with kidney stones.  Drink plenty of fluids to help you pass the stone.  Avoid significant amount of NSAIDs right now given that your kidney function is mildly elevated.  Use your pain medication as directed and only as needed for severe pain. Taking flomax as directed will also help to pass the stone. Use Zofran for nausea as directed.  Follow up with the urology clinic listed in regards to your hospital visit.   Return to the ED immediately if you develop fever that persists > 101, uncontrolled pain or vomiting, or other concerns.   Do not drink alcohol, drive or participate in any other potentially dangerous activities while taking opiate pain medication as it may make you sleepy. Do not take this medication with any other sedating medications, either prescription or over-the-counter. If you were prescribed Percocet or Vicodin, do not take these with acetaminophen (Tylenol) as it is already contained within these medications.   This medication is an opiate (or narcotic) pain medication and can be habit forming.  Use it as little as possible to achieve adequate pain control.  Do not use or use it with extreme caution if you have a history of opiate abuse or dependence. This medication is intended for your use only - do not give any to anyone else and keep it in a secure place where nobody else, especially children, have access to it. It will also cause or worsen constipation, so you may want to consider taking an over-the-counter stool softener while you are taking this medication.

## 2020-04-05 ENCOUNTER — Other Ambulatory Visit: Payer: Self-pay

## 2020-04-05 ENCOUNTER — Telehealth: Payer: Self-pay

## 2020-04-05 MED ORDER — SUMATRIPTAN SUCCINATE 50 MG PO TABS
ORAL_TABLET | ORAL | 1 refills | Status: DC
Start: 2020-04-05 — End: 2020-06-27

## 2020-04-05 NOTE — Telephone Encounter (Signed)
New Rx sent in for requested medication. Tried calling patient to notify but VM is full so unable to leave message.

## 2020-04-05 NOTE — Telephone Encounter (Signed)
Patient refill request --- would like new prescription for a 90 d/s  SUMAtriptan (IMITREX) 50 MG tablet [[id number]]    [facility] [address], [place] - [address]

## 2020-05-10 ENCOUNTER — Other Ambulatory Visit: Payer: Self-pay | Admitting: Family Medicine

## 2020-05-10 NOTE — Telephone Encounter (Signed)
Pt has a refill awaiting at pharmacy

## 2020-05-10 NOTE — Telephone Encounter (Signed)
Patient would like refill of sumatriptan. Please send to [facility].

## 2020-06-22 ENCOUNTER — Ambulatory Visit: Payer: Self-pay | Admitting: Family Medicine

## 2020-06-22 DIAGNOSIS — Z0289 Encounter for other administrative examinations: Secondary | ICD-10-CM
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
## 2020-06-27 ENCOUNTER — Telehealth: Payer: Self-pay | Admitting: Family Medicine

## 2020-06-27 MED ORDER — SUMATRIPTAN SUCCINATE 50 MG PO TABS
ORAL_TABLET | ORAL | 0 refills | Status: DC
Start: 2020-06-27 — End: 2020-08-09

## 2020-06-27 NOTE — Telephone Encounter (Signed)
RF request for Sumatriptan LOV: 02/25/20 Next ov: advised to f/u Jan Last written: 04/05/20(60,1)  Medication pending

## 2020-06-27 NOTE — Telephone Encounter (Signed)
Patient states he has been needing to take two Sumatriptan pills per day for his migraines. He is requesting refill. Please send to [facility].

## 2020-06-27 NOTE — Telephone Encounter (Signed)
RF'd #[phone]. Pt no-showed 06/22/20. Encourage pt to arrange f/u with me in the next 1-2 mo.

## 2020-06-28 NOTE — Telephone Encounter (Signed)
Notified Lupita Leash [facility]) of needed appt

## 2020-06-28 NOTE — Telephone Encounter (Signed)
Attempted to call pt alternative number Scott Reid), phone did not ring and could not hear anyone on the other end. Could not lvm
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
## 2020-08-01 ENCOUNTER — Ambulatory Visit: Payer: Self-pay | Admitting: Family Medicine

## 2020-08-01 DIAGNOSIS — R7989 Other specified abnormal findings of blood chemistry: Secondary | ICD-10-CM

## 2020-08-01 DIAGNOSIS — Z0289 Encounter for other administrative examinations: Secondary | ICD-10-CM

## 2020-08-08 ENCOUNTER — Encounter: Payer: Self-pay | Admitting: Family Medicine

## 2020-08-08 ENCOUNTER — Other Ambulatory Visit: Payer: Self-pay

## 2020-08-09 ENCOUNTER — Ambulatory Visit (INDEPENDENT_AMBULATORY_CARE_PROVIDER_SITE_OTHER): Payer: Self-pay | Admitting: Family Medicine

## 2020-08-09 ENCOUNTER — Encounter: Payer: Self-pay | Admitting: Family Medicine

## 2020-08-09 ENCOUNTER — Other Ambulatory Visit: Payer: Self-pay | Admitting: Family Medicine

## 2020-08-09 VITALS — BP 122/80 | HR 70 | Temp 97.5°F | Resp 16 | Ht 71.5 in | Wt 222.4 lb

## 2020-08-09 DIAGNOSIS — G44019 Episodic cluster headache, not intractable: Secondary | ICD-10-CM

## 2020-08-09 DIAGNOSIS — F17200 Nicotine dependence, unspecified, uncomplicated: Secondary | ICD-10-CM

## 2020-08-09 DIAGNOSIS — R7989 Other specified abnormal findings of blood chemistry: Secondary | ICD-10-CM

## 2020-08-09 DIAGNOSIS — E7800 Pure hypercholesterolemia, unspecified: Secondary | ICD-10-CM

## 2020-08-09 DIAGNOSIS — N201 Calculus of ureter: Secondary | ICD-10-CM

## 2020-08-09 LAB — BASIC METABOLIC PANEL
BUN: 24 mg/dL — ABNORMAL HIGH (ref 6–23)
CO2: 30 mEq/L (ref 19–32)
Calcium: 9.5 mg/dL (ref 8.4–10.5)
Chloride: 104 mEq/L (ref 96–112)
Creatinine, Ser: 1.19 mg/dL (ref 0.40–1.50)
GFR: 68.73 mL/min (ref >60.00)
Glucose, Bld: 120 mg/dL — ABNORMAL HIGH (ref 70–99)
Potassium: 4.2 mEq/L (ref 3.5–5.1)
Sodium: 140 mEq/L (ref 135–145)

## 2020-08-09 MED ORDER — VERAPAMIL HCL ER 240 MG PO TBCR: 240.0000 mg | EXTENDED_RELEASE_TABLET | Freq: Every day | ORAL | 3 refills | Status: DC

## 2020-08-09 MED ORDER — SUMATRIPTAN SUCCINATE 50 MG PO TABS
ORAL_TABLET | ORAL | 1 refills | Status: DC
Start: 2020-08-09 — End: 2020-12-04

## 2020-08-09 NOTE — Patient Instructions (Signed)
TO HELP BETTER WITH HEADACHE PREVENTION:  Take TWO of your verapamil tabs every evening until you run out of them, then fill the new rx for 240 mg once daily dose of this med.  You can continue to use imitrex AS NEEDED for treatment of headache.
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
## 2020-11-06 ENCOUNTER — Encounter: Payer: Self-pay | Admitting: Family Medicine

## 2020-11-06 ENCOUNTER — Ambulatory Visit (INDEPENDENT_AMBULATORY_CARE_PROVIDER_SITE_OTHER): Payer: Self-pay | Admitting: Family Medicine

## 2020-11-06 ENCOUNTER — Other Ambulatory Visit: Payer: Self-pay

## 2020-11-06 VITALS — BP 104/62 | HR 60 | Temp 97.8°F | Resp 16 | Ht 71.5 in | Wt 222.2 lb

## 2020-11-06 DIAGNOSIS — B351 Tinea unguium: Secondary | ICD-10-CM

## 2020-11-06 DIAGNOSIS — G44019 Episodic cluster headache, not intractable: Secondary | ICD-10-CM

## 2020-11-06 MED ORDER — TOPIRAMATE 25 MG PO TABS: 25.0000 mg | ORAL_TABLET | Freq: Two times a day (BID) | ORAL | 1 refills | Status: DC

## 2020-11-06 MED ORDER — VERAPAMIL HCL ER 240 MG PO TBCR
240.0000 mg | EXTENDED_RELEASE_TABLET | Freq: Every day | ORAL | 3 refills | Status: DC
Start: 2020-11-06 — End: 2023-05-01

## 2020-11-06 MED ORDER — TERBINAFINE HCL 250 MG PO TABS: 250.0000 mg | ORAL_TABLET | Freq: Every day | ORAL | 0 refills | Status: DC

## 2020-11-06 NOTE — Patient Instructions (Signed)
Continue all your current meds. Take 10mg  melatonin every night.  Take topiramate 25mg  once a day for 1 week and then increase to one tab TWICE per day.  Wait 1-2 weeks before taking the lamisil for your toenails.
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
## 2020-11-14 ENCOUNTER — Telehealth: Payer: Self-pay | Admitting: Internal Medicine

## 2020-11-14 DIAGNOSIS — U071 COVID-19: Secondary | ICD-10-CM

## 2020-11-14 HISTORY — DX: COVID-19: U07.1

## 2020-11-14 NOTE — Telephone Encounter (Signed)
Received call from [facility] - health nurse.  Mr Aspinall called in - covid positive.  Reports increased fever 102, body aches, cough and decreased po intake.  Has not been able to eat today.  Reports history of hypertension and copd.  Symptoms have progressed fast today and is not feeling well.  Discussed with nurse.  Informed needed in person evaluation to better determine respiratory status and treatment.  Nurse - notified need to be evaluated this pm.

## 2020-11-14 NOTE — Telephone Encounter (Signed)
Spoke to [employer] (on Hospital doctor).  She informed me she did get in touch with Lupita Leash and instructedthat Mr Scott Reid needed to be seen.  I called Mr Scheaffer and also called Lupita Leash (significant other). Unable to reach.  Had to leave a message with each of them.  Instructions left - needed to be evaluated.

## 2020-11-15 NOTE — Telephone Encounter (Signed)
LM for pt to return call regarding recommendations.  

## 2020-11-15 NOTE — Telephone Encounter (Signed)
[facility] Day - Client TELEPHONE ADVICE RECORD [facility] Patient Name: Scott Reid Gender: Male DOB: April 10, 1966 Age: 55 Y 10 M 5 D Return Phone Number: [phone] (Primary), [phone] (Secondary) Address: City/ State/ Zip: [place] [address] Client [facility] Day - Client Client Site  [facility] [place] - Day Physician Santiago Bumpers - MD Contact Type Call Who Is Calling Patient / Member / Family / Caregiver Call Type Triage / Clinical Caller Name Enid Derry Relationship To Patient Spouse Return Phone Number [phone] (Primary) Chief Complaint Fever (non urgent symptom) (> THREE MONTHS) Reason for Call Symptomatic / Request for Health Information Initial Comment Caller states her husband came home from work and his fever is 102. He is aching as well. She is wondering what to do. She tested positive for COVID19 on Sunday but did not have these symptoms. Translation No Nurse Assessment Nurse: Katrinka Blazing, RN, Katie Date/Time Lamount Cohen Time): 11/14/2020 6:29:04 PM Confirm and document reason for call. If symptomatic, describe symptoms. ---Caller states pt's fever is 102.0 (temporal) 2 minutes ago. Also states he has body aches and a cough. Symptoms started this morning. Denies any other symptoms. States pt is drinking fluids. Spouse is currently positive for Covid. Does the patient have any new or worsening symptoms? ---Yes Will a triage be completed? ---Yes Related visit to physician within the last 2 weeks? ---No Does the PT have any chronic conditions? (i.e. diabetes, asthma, this includes High risk factors for pregnancy, etc.) ---Yes List chronic conditions. ---HTN Is this a behavioral health or substance abuse call? ---No Guidelines Guideline Title Affirmed Question Affirmed Notes Nurse Date/Time Lamount Cohen Time) COVID-19 - Diagnosed or Suspected [1] HIGH RISK for severe COVID complications  (e.g., weak immune Katrinka Blazing, RN, Katie 11/14/2020 6:33:52 PM PLEASE NOTE: All timestamps contained within this report are represented as [place] Standard Time. CONFIDENTIALTY NOTICE: This fax transmission is intended only for the addressee. It contains information that is legally privileged, confidential or otherwise protected from use or disclosure. If you are not the intended recipient, you are strictly prohibited from reviewing, disclosing, copying using or disseminating any of this information or taking any action in reliance on or regarding this information. If you have received this fax in error, please notify us immediately by telephone so that we can arrange for its return to [place]. Phone: [phone], Toll-Free: [phone], Fax: [phone] Page: 2 of 3 Call Id: [id number] Guidelines Guideline Title Affirmed Question Affirmed Notes Nurse Date/Time Lamount Cohen Time) system, age > 64 years, obesity with BMI > 25, pregnant, chronic lung disease or other chronic medical condition) AND [2] COVID symptoms (e.g., cough, fever) (Exceptions: Already seen by PCP and no new or worsening symptoms.) Disp. Time Lamount Cohen Time) Disposition Final User 11/14/2020 6:18:13 PM Send to Clinical Crissie Figures, RN, Clerance Lav 11/14/2020 6:53:57 PM Paged On Call back to [facility], RN, Florentina Addison 11/14/2020 6:49:37 PM Call PCP Now Yes Katrinka Blazing, RN, Katie Caller Disagree/Comply Comply Caller Understands Yes PreDisposition Call Doctor Care Advice Given Per Guideline CALL PCP NOW: * You need to discuss this with your doctor (or NP/PA). * The symptoms are generally treated the same whether you have COVID-19, influenza or some other respiratory virus. * Feeling dehydrated: Drink extra liquids. If the air in your home is dry, use a humidifier. * Fever: For fever over 101 F (38.3 C), take acetaminophen every 4 to 6 hours (Adults 650 mg) OR ibuprofen every 6 to 8 hours (Adults 400 mg). Before taking  any medicine, read  all the instructions on the package. Do not take aspirin unless your doctor has prescribed it for you. * Muscle aches, headache, and other pains: Often this comes and goes with the fever. Take acetaminophen every 4 to 6 hours (Adults 650 mg) OR ibuprofen every 6 to 8 hours (Adults 400 mg). Before taking any medicine, read all the instructions on the package. COUGH MEDICINES: * COUGH DROPS: Over-the-counter cough drops can help a lot, especially for mild coughs. They soothe an irritated throat and remove the tickle sensation in the back of the throat. Cough drops are easy to carry with you. * HOME REMEDY - HARD CANDY: Hard candy works just as well as over-the-counter cough drops. People who have diabetes should use sugar-free candy. * HOME REMEDY - HONEY: This old home remedy has been shown to help decrease coughing at night. The adult dosage is 2 teaspoons (10 ml) at bedtime. FEVER MEDICINES: * For fevers above 101 F (38.3 C) take either acetaminophen or ibuprofen. * They are over-the-counter (OTC) drugs that help treat both fever and pain. You can buy them at the drugstore. * The goal of fever therapy is to bring the fever down to a comfortable level. Remember that fever medicine usually lowers fever 2 degrees F (1 - 1 1/2 degrees C). CALL BACK IF: * You become worse CARE ADVICE given per COVID-19 - DIAGNOSED OR SUSPECTED (Adult) guideline. PLEASE NOTE: All timestamps contained within this report are represented as [place] Standard Time. CONFIDENTIALTY NOTICE: This fax transmission is intended only for the addressee. It contains information that is legally privileged, confidential or otherwise protected from use or disclosure. If you are not the intended recipient, you are strictly prohibited from reviewing, disclosing, copying using or disseminating any of this information or taking any action in reliance on or regarding this information. If you have received this fax in error, please notify us  immediately by telephone so that we can arrange for its return to [place]. Phone: [phone], Toll-Free: [phone], Fax: [phone] Page: 3 of 3 Call Id: [id number] Comments User: Clearnce Sorrel, RN Date/Time Lamount Cohen Time): 11/14/2020 6:34:58 PM pt does have hx of COPD User: Clearnce Sorrel, RN Date/Time Lamount Cohen Time): 11/14/2020 6:48:35 PM [facility] User: Clearnce Sorrel, RN Date/Time Lamount Cohen Time): 11/14/2020 6:57:17 PM attempted to reach office on backline, no answer. User: Clearnce Sorrel, RN Date/Time Lamount Cohen Time): 11/14/2020 8:49:20 PM Dr Lorin Picket called back to confirm patient was going to the emergency department because she was not able to reach him. Dr Lorin Picket was informed that spouse seemed agreeable to pt being evaluated in the emergency department. Paging DoctorName Phone DateTime Result/ Outcome Message Type Notes Dale [place] - MD [phone] 11/14/2020 6:53:57 PM Called On Call Provider - Left Message Doctor Paged Dale [place] - MD 11/14/2020 7:35:20 PM Spoke with On Call - General Message Result RN gave report to on call provider Dr Lorin Picket. She recommends patient be seen at the Emergency Department. Pt's wife notified and verbalized understanding

## 2020-11-15 NOTE — Telephone Encounter (Signed)
I agree that he needs in-person eval in an urgent care or emergency department.

## 2020-11-15 NOTE — Telephone Encounter (Signed)
Pt went to ED last night but does feel a little better today. Pt was given steroid Rx

## 2020-12-04 ENCOUNTER — Telehealth: Payer: Self-pay

## 2020-12-04 MED ORDER — SUMATRIPTAN SUCCINATE 50 MG PO TABS: ORAL_TABLET | ORAL | 0 refills | Status: DC

## 2020-12-04 MED ORDER — ATORVASTATIN CALCIUM 20 MG PO TABS: 1.0000 | ORAL_TABLET | Freq: Every day | ORAL | 0 refills | Status: DC

## 2020-12-04 NOTE — Telephone Encounter (Signed)
Patient refill request.  Has appt on 6/23 with Dr. Fuller Mandril  atorvastatin (LIPITOR) 20 MG tablet [[id number]]   SUMAtriptan (IMITREX) 50 MG tablet [[id number]]    [facility] [id number] - [place], [place] - [address]

## 2020-12-04 NOTE — Telephone Encounter (Signed)
Rx sent for 30 d/s 

## 2020-12-07 ENCOUNTER — Encounter: Payer: Self-pay | Admitting: Family Medicine

## 2020-12-07 ENCOUNTER — Ambulatory Visit (INDEPENDENT_AMBULATORY_CARE_PROVIDER_SITE_OTHER): Payer: Self-pay | Admitting: Family Medicine

## 2020-12-07 ENCOUNTER — Other Ambulatory Visit: Payer: Self-pay

## 2020-12-07 VITALS — BP 106/68 | HR 76 | Temp 98.1°F | Resp 16 | Ht 71.5 in | Wt 213.0 lb

## 2020-12-07 DIAGNOSIS — G44019 Episodic cluster headache, not intractable: Secondary | ICD-10-CM

## 2020-12-07 DIAGNOSIS — B370 Candidal stomatitis: Secondary | ICD-10-CM

## 2020-12-07 MED ORDER — NYSTATIN 100000 UNIT/ML MT SUSP: OROMUCOSAL | 0 refills | Status: DC

## 2020-12-07 MED ORDER — TOPIRAMATE 50 MG PO TABS: 50.0000 mg | ORAL_TABLET | Freq: Two times a day (BID) | ORAL | 2 refills | Status: DC
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
## 2021-01-01 ENCOUNTER — Telehealth: Payer: Self-pay | Admitting: Family Medicine

## 2021-01-01 ENCOUNTER — Other Ambulatory Visit: Payer: Self-pay | Admitting: Family Medicine

## 2021-01-01 MED ORDER — SUMATRIPTAN SUCCINATE 50 MG PO TABS: ORAL_TABLET | ORAL | 0 refills | Status: DC

## 2021-01-01 NOTE — Telephone Encounter (Signed)
Yes, still ok to take sumatriptan, rx sent, keep appt for August.

## 2021-01-01 NOTE — Telephone Encounter (Signed)
During last OV mentioned, Topamax increased to 50mg . Should he still be taking Imitrex?  Please advise if refill appropriate. Med pending

## 2021-01-01 NOTE — Telephone Encounter (Signed)
Patient requesting refill of sumatriptan. Please send to same [facility] in [place].

## 2021-01-02 NOTE — Telephone Encounter (Signed)
Patient returned call and I told him the sumatriptan has been refilled. He voiced understanding.

## 2021-01-02 NOTE — Telephone Encounter (Signed)
Attempted to contact pt, unable to lvm ?

## 2021-01-03 NOTE — Telephone Encounter (Signed)
Noted  

## 2021-01-22 ENCOUNTER — Other Ambulatory Visit: Payer: Self-pay | Admitting: Family Medicine

## 2021-01-22 ENCOUNTER — Telehealth: Payer: Self-pay

## 2021-01-22 MED ORDER — ATORVASTATIN CALCIUM 20 MG PO TABS: 20.0000 mg | ORAL_TABLET | Freq: Every day | ORAL | 0 refills | Status: DC

## 2021-01-22 NOTE — Telephone Encounter (Signed)
Tried calling patient, unable to LVM. Medication sent until appt on 08/25

## 2021-01-22 NOTE — Telephone Encounter (Signed)
Patient refill request.  Patient is scheduled to see Dr. Milinda Cave on 8/25 for CPE.  Patient states he has been without meds for "a while now."  atorvastatin (LIPITOR) 20 MG tablet [[id number]]   [facility] [id number] - [place], [place] - [address]

## 2021-01-22 NOTE — Telephone Encounter (Signed)
Pt advised refill sent along with sumatriptan refill.

## 2021-02-08 ENCOUNTER — Encounter: Payer: Self-pay | Admitting: Family Medicine

## 2021-02-08 ENCOUNTER — Other Ambulatory Visit: Payer: Self-pay

## 2021-02-08 ENCOUNTER — Ambulatory Visit (INDEPENDENT_AMBULATORY_CARE_PROVIDER_SITE_OTHER): Payer: Self-pay | Admitting: Family Medicine

## 2021-02-08 VITALS — BP 103/68 | HR 61 | Temp 97.5°F | Resp 16 | Ht 70.5 in | Wt 211.4 lb

## 2021-02-08 DIAGNOSIS — G44019 Episodic cluster headache, not intractable: Secondary | ICD-10-CM

## 2021-02-08 DIAGNOSIS — E7800 Pure hypercholesterolemia, unspecified: Secondary | ICD-10-CM

## 2021-02-08 DIAGNOSIS — Z125 Encounter for screening for malignant neoplasm of prostate: Secondary | ICD-10-CM

## 2021-02-08 DIAGNOSIS — Z0000 Encounter for general adult medical examination without abnormal findings: Secondary | ICD-10-CM

## 2021-02-08 LAB — COMPREHENSIVE METABOLIC PANEL
ALT: 18 U/L (ref 0–53)
AST: 16 U/L (ref 0–37)
Albumin: 4.3 g/dL (ref 3.5–5.2)
Alkaline Phosphatase: 94 U/L (ref 39–117)
BUN: 17 mg/dL (ref 6–23)
CO2: 23 mEq/L (ref 19–32)
Calcium: 9.5 mg/dL (ref 8.4–10.5)
Chloride: 107 mEq/L (ref 96–112)
Creatinine, Ser: 1.27 mg/dL (ref 0.40–1.50)
GFR: 63.35 mL/min (ref >60.00)
Glucose, Bld: 87 mg/dL (ref 70–99)
Potassium: 4.1 mEq/L (ref 3.5–5.1)
Sodium: 139 mEq/L (ref 135–145)
Total Bilirubin: 0.6 mg/dL (ref 0.2–1.2)
Total Protein: 6.7 g/dL (ref 6.0–8.3)

## 2021-02-08 LAB — CBC WITH DIFFERENTIAL/PLATELET
Basophils Absolute: 0.1 10*3/uL (ref 0.0–0.1)
Basophils Relative: 0.8 % (ref 0.0–3.0)
Eosinophils Absolute: 0.1 10*3/uL (ref 0.0–0.7)
Eosinophils Relative: 1.7 % (ref 0.0–5.0)
HCT: 44.9 % (ref 39.0–52.0)
Hemoglobin: 14.6 g/dL (ref 13.0–17.0)
Lymphocytes Relative: 24.1 % (ref 12.0–46.0)
Lymphs Abs: 1.9 10*3/uL (ref 0.7–4.0)
MCHC: 32.6 g/dL (ref 30.0–36.0)
MCV: 85.4 fl (ref 78.0–100.0)
Monocytes Absolute: 0.6 10*3/uL (ref 0.1–1.0)
Monocytes Relative: 7.6 % (ref 3.0–12.0)
Neutro Abs: 5.2 10*3/uL (ref 1.4–7.7)
Neutrophils Relative %: 65.8 % (ref 43.0–77.0)
Platelets: 193 10*3/uL (ref 150.0–400.0)
RBC: 5.26 Mil/uL (ref 4.22–5.81)
RDW: 13.9 % (ref 11.5–15.5)
WBC: 7.9 10*3/uL (ref 4.0–10.5)

## 2021-02-08 LAB — LIPID PANEL
Cholesterol: 147 mg/dL (ref 0–200)
HDL: 37.7 mg/dL — ABNORMAL LOW (ref >39.00)
LDL Cholesterol: 94 mg/dL (ref 0–99)
NonHDL: 109.54
Total CHOL/HDL Ratio: 4
Triglycerides: 76 mg/dL (ref 0.0–149.0)
VLDL: 15.2 mg/dL (ref 0.0–40.0)

## 2021-02-08 LAB — TSH: TSH: 1.25 u[IU]/mL (ref 0.35–5.50)

## 2021-02-08 LAB — PSA: PSA: 1.26 ng/mL (ref 0.10–4.00)

## 2021-02-08 MED ORDER — ATORVASTATIN CALCIUM 20 MG PO TABS: 20.0000 mg | ORAL_TABLET | Freq: Every day | ORAL | 3 refills | Status: DC

## 2021-02-08 MED ORDER — SUMATRIPTAN SUCCINATE 50 MG PO TABS: ORAL_TABLET | ORAL | 1 refills | Status: DC

## 2021-02-08 MED ORDER — TOPIRAMATE 50 MG PO TABS: 50.0000 mg | ORAL_TABLET | Freq: Two times a day (BID) | ORAL | 3 refills | Status: DC

## 2021-02-08 NOTE — Patient Instructions (Signed)
Health Maintenance, Male Adopting a healthy lifestyle and getting preventive care are important in promoting health and wellness. Ask your health care provider about: The right schedule for you to have regular tests and exams. Things you can do on your own to prevent diseases and keep yourself healthy. What should I know about diet, weight, and exercise? Eat a healthy diet  Eat a diet that includes plenty of vegetables, fruits, low-fat dairy products, and lean protein. Do not eat a lot of foods that are high in solid fats, added sugars, or sodium.  Maintain a healthy weight Body mass index (BMI) is a measurement that can be used to identify possible weight problems. It estimates body fat based on height and weight. Your health care provider can help determine your BMI and help you achieve or maintain ahealthy weight. Get regular exercise Get regular exercise. This is one of the most important things you can do for your health. Most adults should: Exercise for at least 150 minutes each week. The exercise should increase your heart rate and make you sweat (moderate-intensity exercise). Do strengthening exercises at least twice a week. This is in addition to the moderate-intensity exercise. Spend less time sitting. Even light physical activity can be beneficial. Watch cholesterol and blood lipids Have your blood tested for lipids and cholesterol at 56 years of age, then havethis test every 5 years. You may need to have your cholesterol levels checked more often if: Your lipid or cholesterol levels are high. You are older than 56 years of age. You are at high risk for heart disease. What should I know about cancer screening? Many types of cancers can be detected early and may often be prevented. Depending on your health history and family history, you may need to have cancer screening at various ages. This may include screening for: Colorectal cancer. Prostate cancer. Skin cancer. Lung  cancer. What should I know about heart disease, diabetes, and high blood pressure? Blood pressure and heart disease High blood pressure causes heart disease and increases the risk of stroke. This is more likely to develop in people who have high blood pressure readings, are of African descent, or are overweight. Talk with your health care provider about your target blood pressure readings. Have your blood pressure checked: Every 3-5 years if you are 18-39 years of age. Every year if you are 40 years old or older. If you are between the ages of 65 and 75 and are a current or former smoker, ask your health care provider if you should have a one-time screening for abdominal aortic aneurysm (AAA). Diabetes Have regular diabetes screenings. This checks your fasting blood sugar level. Have the screening done: Once every three years after age 45 if you are at a normal weight and have a low risk for diabetes. More often and at a younger age if you are overweight or have a high risk for diabetes. What should I know about preventing infection? Hepatitis B If you have a higher risk for hepatitis B, you should be screened for this virus. Talk with your health care provider to find out if you are at risk forhepatitis B infection. Hepatitis C Blood testing is recommended for: Everyone born from 1945 through 1965. Anyone with known risk factors for hepatitis C. Sexually transmitted infections (STIs) You should be screened each year for STIs, including gonorrhea and chlamydia, if: You are sexually active and are younger than 56 years of age. You are older than 56 years of age   and your health care provider tells you that you are at risk for this type of infection. Your sexual activity has changed since you were last screened, and you are at increased risk for chlamydia or gonorrhea. Ask your health care provider if you are at risk. Ask your health care provider about whether you are at high risk for HIV.  Your health care provider may recommend a prescription medicine to help prevent HIV infection. If you choose to take medicine to prevent HIV, you should first get tested for HIV. You should then be tested every 3 months for as long as you are taking the medicine. Follow these instructions at home: Lifestyle Do not use any products that contain nicotine or tobacco, such as cigarettes, e-cigarettes, and chewing tobacco. If you need help quitting, ask your health care provider. Do not use street drugs. Do not share needles. Ask your health care provider for help if you need support or information about quitting drugs. Alcohol use Do not drink alcohol if your health care provider tells you not to drink. If you drink alcohol: Limit how much you have to 0-2 drinks a day. Be aware of how much alcohol is in your drink. In the [place] one drink equals one 12 oz bottle of beer (355 mL), one 5 oz glass of wine (148 mL), or one 1 oz glass of hard liquor (44 mL). General instructions Schedule regular health, dental, and eye exams. Stay current with your vaccines. Tell your health care provider if: You often feel depressed. You have ever been abused or do not feel safe at home. Summary Adopting a healthy lifestyle and getting preventive care are important in promoting health and wellness. Follow your health care provider's instructions about healthy diet, exercising, and getting tested or screened for diseases. Follow your health care provider's instructions on monitoring your cholesterol and blood pressure. This information is not intended to replace advice given to you by your health care provider. Make sure you discuss any questions you have with your healthcare provider. Document Revised: 05/27/2018 Document Reviewed: 05/27/2018 Elsevier Patient Education  2022 Elsevier Inc.  
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
## 2021-05-24 ENCOUNTER — Other Ambulatory Visit: Payer: Self-pay | Admitting: Family Medicine

## 2021-05-24 ENCOUNTER — Telehealth: Payer: Self-pay

## 2021-05-24 NOTE — Telephone Encounter (Signed)
Pt requesting refill on SUMAtriptan (IMITREX) 50 MG tablet that is used PRN medication. Pt states he takes 2 tabs a day. Pt was informed to contact his pharmacy for possible last refill.  Note:  Wanted to make provider aware that pt takes rx QD instead of PRN.

## 2021-05-24 NOTE — Telephone Encounter (Signed)
Yes, Im aware

## 2021-07-02 ENCOUNTER — Other Ambulatory Visit: Payer: Self-pay | Admitting: Family Medicine

## 2021-07-04 NOTE — Telephone Encounter (Signed)
Last phone note from 12/8 states:   Pt requesting refill on SUMAtriptan (IMITREX) 50 MG tablet that is used PRN medication. Pt states he takes 2 tabs a day. Pt was informed to contact his pharmacy for possible last refill.   Note:  Wanted to make provider aware that pt takes rx QD instead of PRN

## 2021-07-04 NOTE — Telephone Encounter (Signed)
Patient states he is out of meds for his headaches. He states he tries not to take more that 2 a day.  Pharmacy is stating he cannot get prescription filled until 1/20.  Insurance is rejecting "too soon to fill". Does dosage need to be updated?  Please advise.  Patient can be reached at [phone].  He states he works outside, so if he does not answer please leave message and we call back.  [facility] [address]  SUMAtriptan (IMITREX) 50 MG tablet [[id number]]

## 2021-07-05 NOTE — Telephone Encounter (Signed)
Pt advised refill sent. °

## 2021-08-07 ENCOUNTER — Telehealth: Payer: Self-pay | Admitting: Family Medicine

## 2021-08-07 NOTE — Telephone Encounter (Signed)
Informed pt that he has an upcoming appt this Friday on 08/10/2021.  Med refill:  SUMAtriptan SUMAtriptan (IMITREX) 50 MG tablet   [facility] [id number] [place], [place] - [address] Phone:  [phone]  Fax:  [phone]

## 2021-08-08 NOTE — Telephone Encounter (Signed)
Called pt's pharmacy, spoke with [facility] and confirmed 1 refill remaining. They will process it for refill. LM for pt to inform of update

## 2021-08-08 NOTE — Telephone Encounter (Signed)
Pt called about medication   Informed pt that he may still have 1 refill remaining.  Wanted to know can we call it in. Told pt that he needs to contact [employer].

## 2021-08-08 NOTE — Telephone Encounter (Signed)
Last rx written 07/04/20(60,1), pt may still have 1 refill remaining. Tried to contact pt, unable to LVM

## 2021-08-10 ENCOUNTER — Other Ambulatory Visit: Payer: Self-pay

## 2021-08-10 ENCOUNTER — Ambulatory Visit (INDEPENDENT_AMBULATORY_CARE_PROVIDER_SITE_OTHER): Payer: Self-pay | Admitting: Family Medicine

## 2021-08-10 ENCOUNTER — Encounter: Payer: Self-pay | Admitting: Family Medicine

## 2021-08-10 VITALS — BP 114/73 | HR 62 | Temp 97.6°F | Ht 70.5 in | Wt 219.8 lb

## 2021-08-10 DIAGNOSIS — G44029 Chronic cluster headache, not intractable: Secondary | ICD-10-CM

## 2021-08-10 DIAGNOSIS — E7800 Pure hypercholesterolemia, unspecified: Secondary | ICD-10-CM

## 2021-08-10 MED ORDER — SUMATRIPTAN SUCCINATE 50 MG PO TABS: ORAL_TABLET | ORAL | 3 refills | Status: DC

## 2021-08-10 MED ORDER — TOPIRAMATE 100 MG PO TABS: 100.0000 mg | ORAL_TABLET | Freq: Two times a day (BID) | ORAL | 5 refills | Status: DC
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
## 2021-11-26 ENCOUNTER — Telehealth: Payer: Self-pay

## 2021-11-26 NOTE — Telephone Encounter (Signed)
SUMAtriptan (IMITREX) 50 MG tablet [[id number]]    Order Details Dose, Route, Frequency: As Directed  Dispense Quantity: 90 tablet Refills: 3        Sig: TAKE ONE TABLET BY MOUTH AT ONSET OF HEADACHE; MAY REPEAT ONE TABLET IN 2 HOURS IF NEEDED.       Start Date: 08/10/21 End Date: --  Written Date: 08/10/21 Expiration Date: 08/10/22  Original Order:  SUMAtriptan (IMITREX) 50 MG tablet [[id number]]    Pt has refills remaining. Tried calling patient, unable to LVM.

## 2021-11-26 NOTE — Telephone Encounter (Signed)
Patient refill request.  Scott Reid. Patient takes up to 2 tablets per day, and has on occasions taken 3 tablets.  Pharmacy states that patient does not have any refills on file. [facility] can be reached at [phone].   SUMAtriptan (IMITREX) 50 MG tablet [[id number]]

## 2021-11-27 NOTE — Telephone Encounter (Signed)
Pt advised refill available at the pharmacy.

## 2021-11-27 NOTE — Telephone Encounter (Signed)
Pt advised he has refill remaining on file. He would like updated rx.   Please review and advise

## 2021-11-27 NOTE — Telephone Encounter (Signed)
Please inform patient that he just needs to request refill from his pharmacy.  The prescription I did on 08/10/2021 had 90 tabs with 3 refills.

## 2022-07-15 ENCOUNTER — Other Ambulatory Visit: Payer: Self-pay | Admitting: Family Medicine

## 2022-07-15 NOTE — Telephone Encounter (Signed)
Please fill, if appropriate.

## 2022-07-15 NOTE — Telephone Encounter (Signed)
Pt states he needs a refill for SUMAtriptan (IMITREX) 50 MG tablet and asking fo 90 day supply. He states that he can not afford a office visit and has been out of work due to an injury for a couple of months. Please contact patient to discuss.

## 2022-07-16 MED ORDER — SUMATRIPTAN SUCCINATE 50 MG PO TABS: ORAL_TABLET | ORAL | 0 refills | Status: DC

## 2022-07-16 NOTE — Telephone Encounter (Signed)
Pt advised refill sent. °

## 2022-09-11 IMAGING — CT CT RENAL STONE PROTOCOL
2 of 4 series · 16 of 46 positions shown, 18 images · non-contrast
Comparison: None

CLINICAL DATA: Right flank pain hematuria, nausea and vomiting for
1 week

EXAM:
CT ABDOMEN AND PELVIS WITHOUT CONTRAST
TECHNIQUE: Multidetector CT imaging of the abdomen and pelvis was performed
following the standard protocol without IV contrast.

[Series 2: axial st · axial · 0.93mm/px · z∈[-577,-117]mm · 13 of 100 slices shown, 15 images]
[im 4/100  soft-tissue]
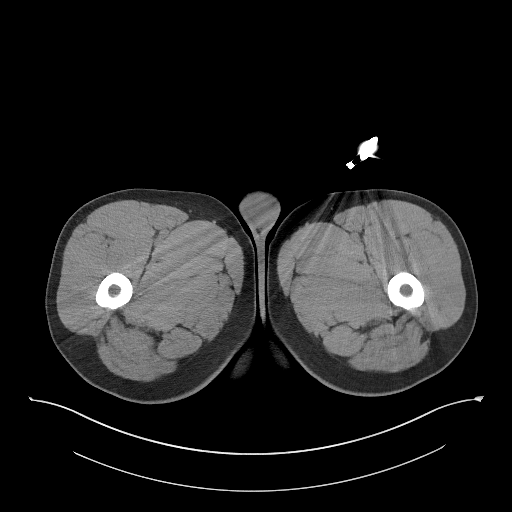
[im 4/100  bone]
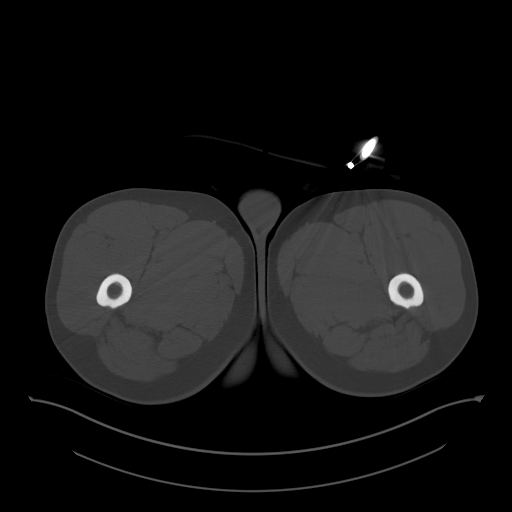
[im 12/100  soft-tissue]
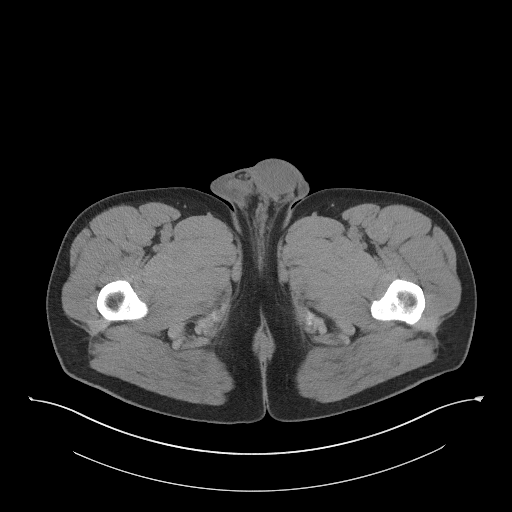
[im 20/100  soft-tissue]
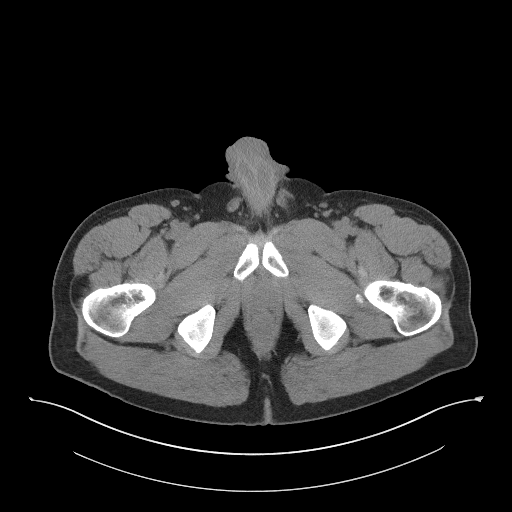
[im 28/100  soft-tissue]
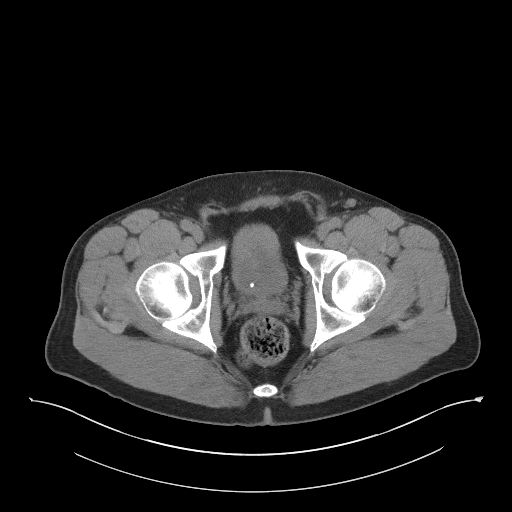
[im 36/100  soft-tissue]
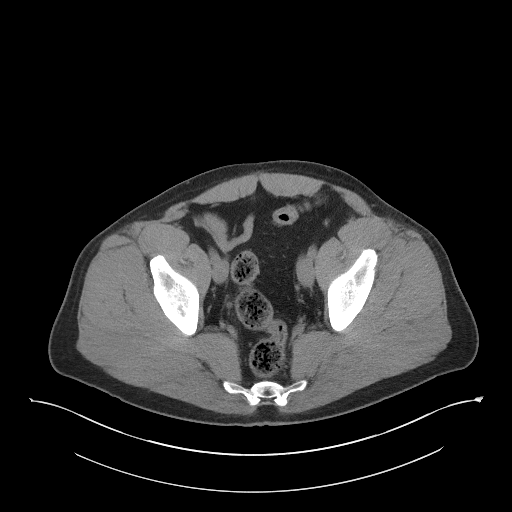
[im 44/100  soft-tissue]
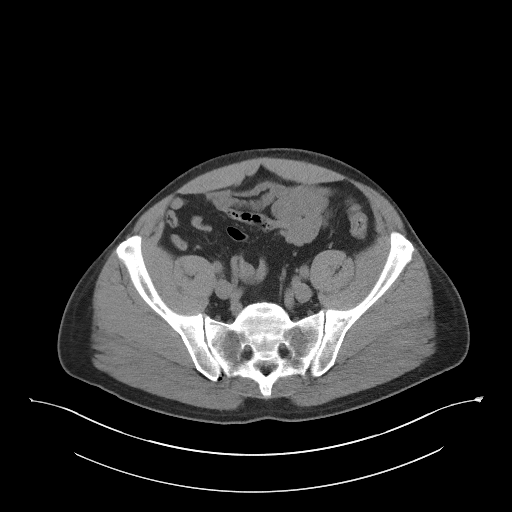
[im 52/100  soft-tissue]
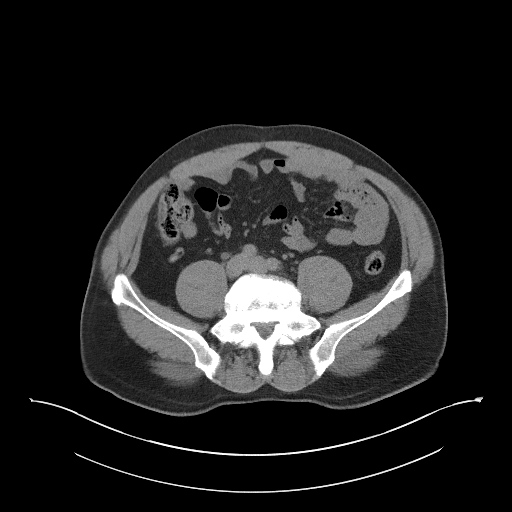
[im 56/100  soft-tissue]
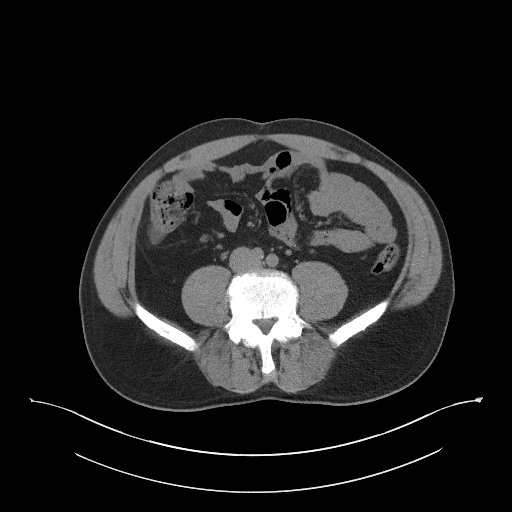
[im 64/100  soft-tissue]
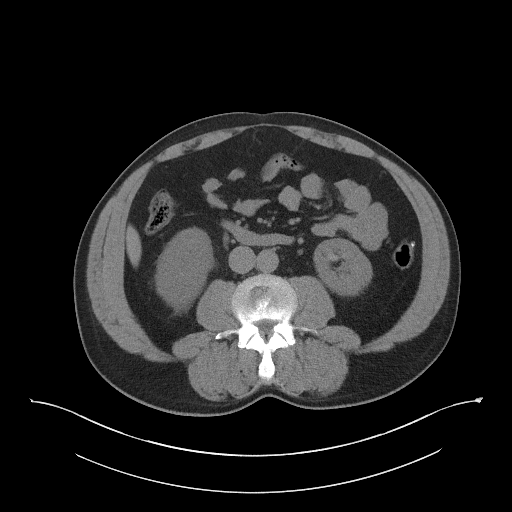
[im 64/100  bone]
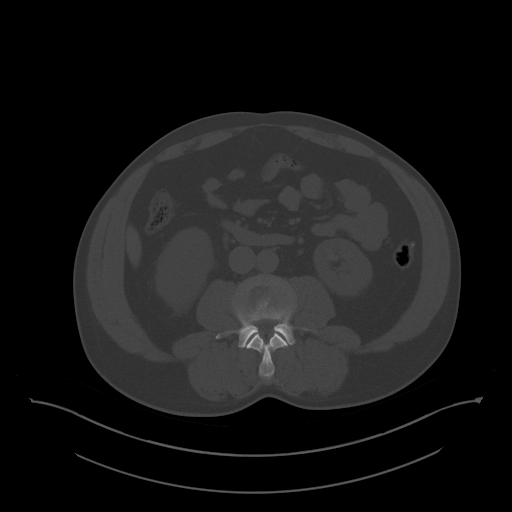
[im 72/100  soft-tissue]
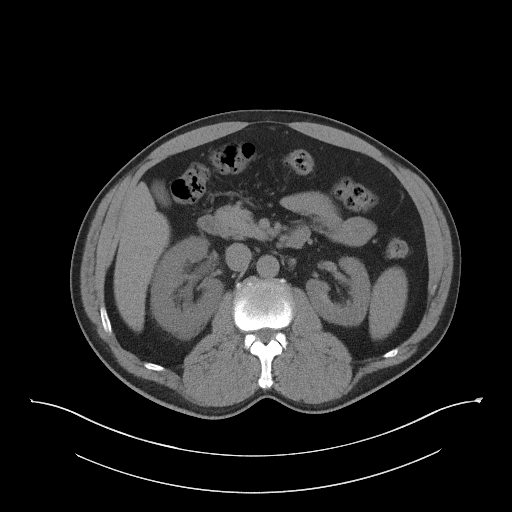
[im 80/100  soft-tissue]
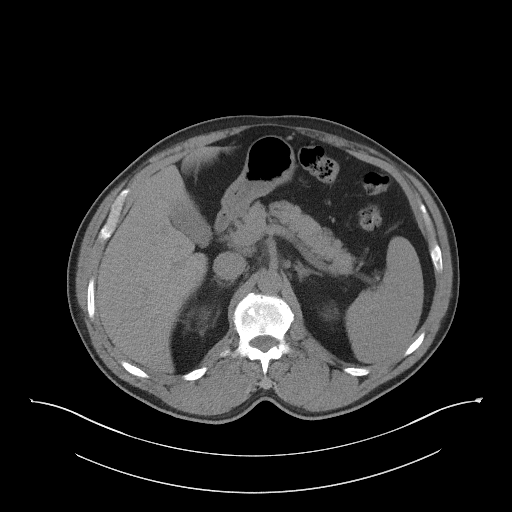
[im 88/100  soft-tissue]
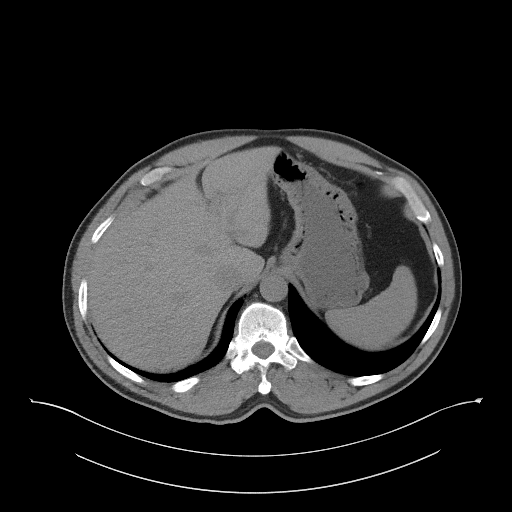
[im 96/100  soft-tissue]
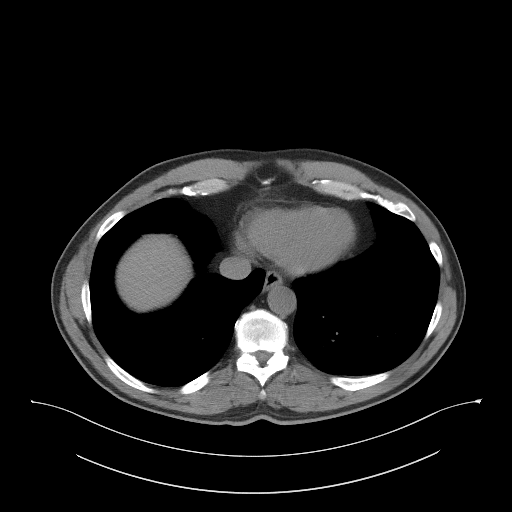

[Series 5: coronal st · coronal · 0.88mm/px · 3 of 101 slices shown]
[im 34/101  soft-tissue]
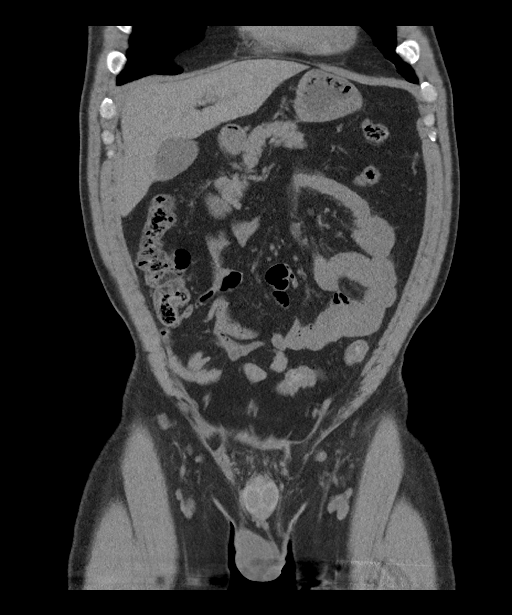
[im 45/101  soft-tissue]
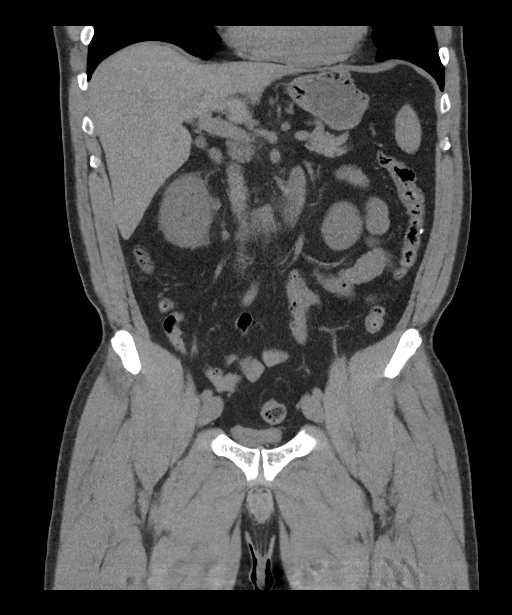
[im 56/101  soft-tissue]
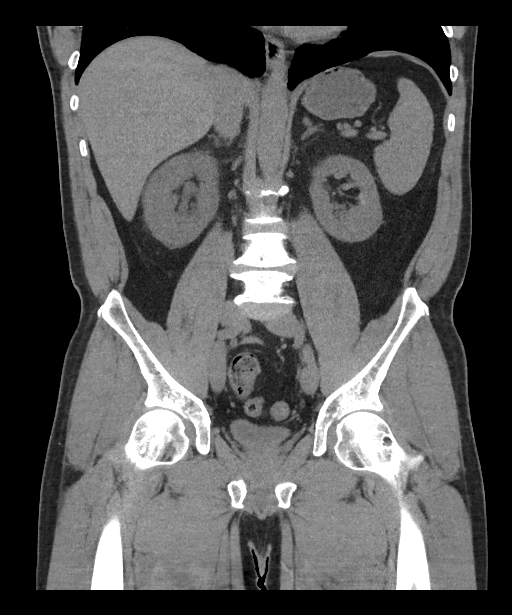

[16 of 46 positions shown; findings below may reference images not displayed]

FINDINGS: Lower chest: Lung bases are clear. Normal heart size. No pericardial
effusion.

Hepatobiliary: No focal liver lesion. Normal liver attenuation with
smooth surface contour. Normal gallbladder and biliary tree.

Pancreas: No pancreatic ductal dilatation or surrounding
inflammatory changes.

Spleen: Normal in size. No concerning splenic lesions.

Adrenals/Urinary Tract: Normal adrenal glands. Asymmetrically
edematous appearing right kidney with mild hydroureteronephrosis to
the level of the urinary bladder where a 4 mm calcification is seen
along the right posterolateral bladder wall, possibly layering
dependently or protruding into the lumen at the level of the
ureterovesicular orifice. No other urolithiasis or left urinary
tract dilatation. No focal renal lesions. Urinary bladder is largely
decompressed at the time of exam and therefore poorly evaluated by
CT imaging. Urinary bladder is otherwise grossly unremarkable.

Stomach/Bowel: Distal esophagus, stomach and duodenal sweep are
unremarkable. No small bowel wall thickening or dilatation. No
evidence of obstruction. A normal appendix is visualized. No colonic
dilatation or wall thickening.

Vascular/Lymphatic: Atherosclerotic calcifications within the
abdominal aorta and branch vessels. No aneurysm or ectasia. No
enlarged abdominopelvic lymph nodes.

Reproductive: Coarse eccentric calcification of the prostate. No
concerning abnormalities of the prostate or seminal vesicles.

Other: No abdominopelvic free fluid or free gas. No bowel containing
hernias.

Musculoskeletal: No acute osseous abnormality or suspicious osseous
lesion. Multilevel degenerative changes are present in the imaged
portions of the spine. Additional degenerative changes in the hips
and pelvis. Congenital nonfusion of the L1 transverse processes and
a transitional lumbosacral vertebrae are noted incidentally.
IMPRESSION: 1. Mild right hydroureteronephrosis with surrounding Peri nephric
and periureteral stranding to the level of the urinary bladder where
a 4 mm calcification is seen along the right posterolateral bladder
wall, possibly layering dependently or protruding into the lumen at
the level of the ureterovesicular orifice.
2. Aortic Atherosclerosis ([id number]).

## 2022-10-18 ENCOUNTER — Telehealth: Payer: Self-pay | Admitting: Family Medicine

## 2022-10-18 NOTE — Telephone Encounter (Signed)
Pt needs an appt, last o/v 08/10/21.

## 2022-10-18 NOTE — Telephone Encounter (Signed)
Patient needs refill on   SUMAtriptan (IMITREX) 50 MG tablet   Pharmacy is correct as [facility]

## 2022-10-21 MED ORDER — SUMATRIPTAN SUCCINATE 50 MG PO TABS: ORAL_TABLET | ORAL | 0 refills | Status: DC

## 2022-10-21 NOTE — Telephone Encounter (Signed)
Patient is scheduled for Wednesday 5/8.

## 2022-10-23 ENCOUNTER — Ambulatory Visit: Payer: Self-pay | Admitting: Family Medicine

## 2022-10-25 NOTE — Patient Instructions (Signed)

## 2022-10-28 ENCOUNTER — Encounter: Payer: Self-pay | Admitting: Family Medicine

## 2022-10-28 ENCOUNTER — Ambulatory Visit (INDEPENDENT_AMBULATORY_CARE_PROVIDER_SITE_OTHER): Payer: 59 | Admitting: Family Medicine

## 2022-10-28 VITALS — BP 132/86 | HR 81 | Wt 222.8 lb

## 2022-10-28 DIAGNOSIS — E7800 Pure hypercholesterolemia, unspecified: Secondary | ICD-10-CM | POA: Diagnosis not present

## 2022-10-28 DIAGNOSIS — G44019 Episodic cluster headache, not intractable: Secondary | ICD-10-CM | POA: Diagnosis not present

## 2022-10-28 MED ORDER — SUMATRIPTAN SUCCINATE 50 MG PO TABS: ORAL_TABLET | ORAL | 0 refills | Status: DC
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
## 2022-10-29 LAB — LIPID PANEL
Cholesterol: 184 mg/dL (ref 0–200)
HDL: 37.6 mg/dL — ABNORMAL LOW (ref >39.00)
LDL Cholesterol: 111 mg/dL — ABNORMAL HIGH (ref 0–99)
NonHDL: 146.51
Total CHOL/HDL Ratio: 5
Triglycerides: 179 mg/dL — ABNORMAL HIGH (ref 0.0–149.0)
VLDL: 35.8 mg/dL (ref 0.0–40.0)

## 2022-10-29 LAB — AST: AST: 15 U/L (ref 0–37)

## 2022-10-29 LAB — ALT: ALT: 19 U/L (ref 0–53)

## 2022-11-06 ENCOUNTER — Other Ambulatory Visit: Payer: Self-pay

## 2022-11-06 ENCOUNTER — Telehealth: Payer: Self-pay

## 2022-11-06 DIAGNOSIS — J449 Chronic obstructive pulmonary disease, unspecified: Secondary | ICD-10-CM

## 2022-11-06 MED ORDER — ALBUTEROL SULFATE HFA 108 (90 BASE) MCG/ACT IN AERS
1.0000 | INHALATION_SPRAY | RESPIRATORY_TRACT | 2 refills | Status: AC | PRN
Start: 2022-11-06 — End: ?

## 2022-11-06 NOTE — Telephone Encounter (Signed)
Patient was seen by Dr. Milinda Cave on 5/13. Patient very upset he went to pharmacy yesterday to pick up 2 prescriptions. The migraine medication only had #10 tablets, and inhaler prescription wasn't there. He said he wasted his time and gas, and is very upset.  Please send prescriptions to pharmacy.  [facility] [address]  albuterol (VENTOLIN HFA) 108 (90 Base) MCG/ACT inhaler   SUMAtriptan (IMITREX) 50 MG tablet  (Please make sure this is 90 d/s)

## 2022-11-06 NOTE — Telephone Encounter (Signed)
Called pt but unable to leave VM. 90 tablets was sent for Sumatriptan on 10/28/22 by Dr. Milinda Cave. I sent Rx for Albuterol today

## 2022-11-12 ENCOUNTER — Other Ambulatory Visit: Payer: Self-pay | Admitting: Family Medicine

## 2022-11-15 ENCOUNTER — Other Ambulatory Visit: Payer: Self-pay | Admitting: Family Medicine

## 2022-11-18 ENCOUNTER — Other Ambulatory Visit: Payer: Self-pay

## 2022-11-18 ENCOUNTER — Telehealth: Payer: Self-pay

## 2022-11-18 MED ORDER — SUMATRIPTAN SUCCINATE 50 MG PO TABS: ORAL_TABLET | ORAL | 0 refills | Status: DC

## 2022-11-18 NOTE — Telephone Encounter (Signed)
Pharmacy contacted, previous rx sent on 5/13 was only received for 10 tabs. New rx sent, pt advised. He was also made aware if rx not received for 90 to have the pharmacy send a new request for this medication

## 2022-11-18 NOTE — Telephone Encounter (Signed)
Patient calling to check status of prescription that was supposed to be called in middle of May.  Patient very upset that he has driven out there twice and prescription was not ready for pick up.  I advised him to call pharmacy first before heading out, and/or sign up for text messages from [facility] if they have available.  Patient stated he wanted to talk to Dr. Milinda Cave about this issue.  I told him Dr. Milinda Cave is seeing patients and his clinical assistant would be the person who calls him back.  Patient hung up phone and call was disconnected.  [facility] (please call pharmacy to verify what is going on.  It looks like it has been sent 2x to them.  SUMAtriptan (IMITREX) 50 MG tablet   Please call patient [phone]

## 2023-01-30 ENCOUNTER — Telehealth: Payer: Self-pay | Admitting: Family Medicine

## 2023-01-30 ENCOUNTER — Other Ambulatory Visit: Payer: Self-pay | Admitting: Family Medicine

## 2023-01-30 NOTE — Telephone Encounter (Signed)
Prescription Request  01/30/2023  LOV: 10/28/2022  What is the name of the medication or equipment? SUMAtriptan (IMITREX) 50 MG tablet  atorvastatin (LIPITOR) 20 MG tablet   Have you contacted your pharmacy to request a refill? Yes   Which pharmacy would you like this sent to?  [id number] [place], [place] - [address] [place] [place] [address] Phone: [phone] Fax: [phone]    Patient notified that their request is being sent to the clinical staff for review and that they should receive a response within 2 business days.   Please advise at Mobile [phone] (mobile)

## 2023-04-25 ENCOUNTER — Telehealth: Payer: Self-pay | Admitting: Family Medicine

## 2023-04-25 NOTE — Telephone Encounter (Signed)
Pt should have enough til appointment.

## 2023-04-25 NOTE — Telephone Encounter (Signed)
Patient is scheduled for 11/14. However he wanted to be proactive and get a 90 day refill started for SUMAtriptan (IMITREX) 50 MG tablet.  He mentioned he has about 8 tablets left.

## 2023-05-01 ENCOUNTER — Ambulatory Visit (INDEPENDENT_AMBULATORY_CARE_PROVIDER_SITE_OTHER): Payer: 59 | Admitting: Family Medicine

## 2023-05-01 ENCOUNTER — Encounter: Payer: Self-pay | Admitting: Family Medicine

## 2023-05-01 VITALS — BP 124/75 | HR 61 | Wt 228.0 lb

## 2023-05-01 DIAGNOSIS — G44019 Episodic cluster headache, not intractable: Secondary | ICD-10-CM | POA: Diagnosis not present

## 2023-05-01 MED ORDER — VERAPAMIL HCL ER 240 MG PO TBCR: 240.0000 mg | EXTENDED_RELEASE_TABLET | Freq: Every day | ORAL | 1 refills | Status: DC

## 2023-05-01 MED ORDER — SUMATRIPTAN SUCCINATE 50 MG PO TABS: ORAL_TABLET | ORAL | 0 refills | Status: DC

## 2023-05-01 MED ORDER — ATORVASTATIN CALCIUM 20 MG PO TABS: 20.0000 mg | ORAL_TABLET | Freq: Every day | ORAL | 1 refills | Status: DC
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
## 2023-09-26 ENCOUNTER — Other Ambulatory Visit: Payer: Self-pay | Admitting: Family Medicine

## 2023-10-31 ENCOUNTER — Encounter: Payer: Self-pay | Admitting: Family Medicine

## 2023-10-31 ENCOUNTER — Ambulatory Visit (INDEPENDENT_AMBULATORY_CARE_PROVIDER_SITE_OTHER): Payer: 59 | Admitting: Family Medicine

## 2023-10-31 VITALS — BP 130/70 | HR 69 | Temp 97.8°F | Ht 70.5 in | Wt 221.4 lb

## 2023-10-31 DIAGNOSIS — G44019 Episodic cluster headache, not intractable: Secondary | ICD-10-CM

## 2023-10-31 DIAGNOSIS — E7800 Pure hypercholesterolemia, unspecified: Secondary | ICD-10-CM

## 2023-10-31 DIAGNOSIS — Z125 Encounter for screening for malignant neoplasm of prostate: Secondary | ICD-10-CM

## 2023-10-31 DIAGNOSIS — Z0000 Encounter for general adult medical examination without abnormal findings: Secondary | ICD-10-CM

## 2023-10-31 DIAGNOSIS — Z1211 Encounter for screening for malignant neoplasm of colon: Secondary | ICD-10-CM

## 2023-10-31 MED ORDER — ATORVASTATIN CALCIUM 20 MG PO TABS: 20.0000 mg | ORAL_TABLET | Freq: Every day | ORAL | 1 refills | Status: AC

## 2023-10-31 MED ORDER — SUMATRIPTAN SUCCINATE 50 MG PO TABS: ORAL_TABLET | ORAL | 1 refills | Status: AC

## 2023-10-31 MED ORDER — VERAPAMIL HCL ER 240 MG PO TBCR: 240.0000 mg | EXTENDED_RELEASE_TABLET | Freq: Every day | ORAL | 1 refills | Status: AC
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
## 2024-05-07 ENCOUNTER — Encounter: Payer: Self-pay | Admitting: Family Medicine
